# Patient Record
Sex: Female | Born: 1990 | Race: Black or African American | Hispanic: No | Marital: Married | Smoking: Former smoker
Health system: Southern US, Community
[De-identification: ages and names within clinical notes are randomized; demographics above are authoritative.]

## PROBLEM LIST (undated history)

## (undated) ENCOUNTER — Emergency Department (HOSPITAL_COMMUNITY): Admission: EM | Payer: 59 | Source: Home / Self Care

## (undated) DIAGNOSIS — O24419 Gestational diabetes mellitus in pregnancy, unspecified control: Secondary | ICD-10-CM

## (undated) DIAGNOSIS — J45909 Unspecified asthma, uncomplicated: Secondary | ICD-10-CM

## (undated) HISTORY — PX: NO PAST SURGERIES: SHX2092

## (undated) HISTORY — DX: Gestational diabetes mellitus in pregnancy, unspecified control: O24.419

---

## 2020-09-14 ENCOUNTER — Other Ambulatory Visit: Payer: Self-pay

## 2020-09-14 ENCOUNTER — Encounter (HOSPITAL_COMMUNITY): Payer: Self-pay | Admitting: Pediatrics

## 2020-09-14 ENCOUNTER — Emergency Department (HOSPITAL_COMMUNITY)
Admission: EM | Admit: 2020-09-14 | Discharge: 2020-09-14 | Disposition: A | Discharge status: Home / Self Care | Payer: Medicaid Other | Attending: Emergency Medicine | Admitting: Emergency Medicine

## 2020-09-14 DIAGNOSIS — Z5321 Procedure and treatment not carried out due to patient leaving prior to being seen by health care provider: Secondary | ICD-10-CM | POA: Diagnosis not present

## 2020-09-14 DIAGNOSIS — U071 COVID-19: Secondary | ICD-10-CM | POA: Diagnosis not present

## 2020-09-14 DIAGNOSIS — M791 Myalgia, unspecified site: Secondary | ICD-10-CM | POA: Diagnosis present

## 2020-09-14 LAB — RESPIRATORY PANEL BY RT PCR (FLU A&B, COVID)
Influenza A by PCR: NEGATIVE
Influenza B by PCR: NEGATIVE
SARS Coronavirus 2 by RT PCR: POSITIVE — AB

## 2020-09-14 NOTE — ED Triage Notes (Signed)
Patient reported tested + on home pregnancy approx 3 days ago; and thought generalized aches and malaise was from that. Stated a family member tested + for covid. Concern for exposure

## 2020-09-14 NOTE — ED Notes (Signed)
Pt states she is leaving  

## 2020-09-15 ENCOUNTER — Other Ambulatory Visit: Payer: Self-pay | Admitting: Family

## 2020-09-15 ENCOUNTER — Telehealth: Payer: Self-pay | Admitting: *Deleted

## 2020-09-15 ENCOUNTER — Other Ambulatory Visit: Payer: Self-pay

## 2020-09-15 ENCOUNTER — Ambulatory Visit
Admission: EM | Admit: 2020-09-15 | Discharge: 2020-09-15 | Disposition: A | Discharge status: Home / Self Care | Payer: Medicaid Other

## 2020-09-15 ENCOUNTER — Encounter: Payer: Self-pay | Admitting: *Deleted

## 2020-09-15 ENCOUNTER — Telehealth: Payer: Self-pay | Admitting: Family

## 2020-09-15 DIAGNOSIS — Z3201 Encounter for pregnancy test, result positive: Secondary | ICD-10-CM

## 2020-09-15 DIAGNOSIS — Z6839 Body mass index (BMI) 39.0-39.9, adult: Secondary | ICD-10-CM

## 2020-09-15 DIAGNOSIS — J4520 Mild intermittent asthma, uncomplicated: Secondary | ICD-10-CM

## 2020-09-15 DIAGNOSIS — Z3A01 Less than 8 weeks gestation of pregnancy: Secondary | ICD-10-CM

## 2020-09-15 DIAGNOSIS — U071 COVID-19: Secondary | ICD-10-CM

## 2020-09-15 HISTORY — DX: Unspecified asthma, uncomplicated: J45.909

## 2020-09-15 LAB — POCT URINE PREGNANCY: Preg Test, Ur: POSITIVE — AB

## 2020-09-15 MED ORDER — ALBUTEROL SULFATE HFA 108 (90 BASE) MCG/ACT IN AERS: 2.0000 | INHALATION_SPRAY | Freq: Four times a day (QID) | RESPIRATORY_TRACT | 2 refills | Status: DC | PRN

## 2020-09-15 MED ORDER — AEROCHAMBER PLUS FLO-VU MEDIUM MISC: 1.0000 | Freq: Once | 0 refills | Status: AC

## 2020-09-15 NOTE — Progress Notes (Signed)
I connected by phone with Susan Clements on 09/15/2020 at 4:15 PM to discuss the potential use of a new treatment for mild to moderate COVID-19 viral infection in non-hospitalized patients.  This patient is a 29 y.o. female that meets the FDA criteria for Emergency Use Authorization of COVID monoclonal antibody casirivimab/imdevimab or bamlanivimab/eteseviamb.  Has a (+) direct SARS-CoV-2 viral test result  Has mild or moderate COVID-19   Is NOT hospitalized due to COVID-19  Is within 10 days of symptom onset  Has at least one of the high risk factor(s) for progression to severe COVID-19 and/or hospitalization as defined in EUA.  Specific high risk criteria : BMI > 25, Pregnancy and Chronic Lung Disease   I have spoken and communicated the following to the patient or parent/caregiver regarding COVID monoclonal antibody treatment:  1. FDA has authorized the emergency use for the treatment of mild to moderate COVID-19 in adults and pediatric patients with positive results of direct SARS-CoV-2 viral testing who are 71 years of age and older weighing at least 40 kg, and who are at high risk for progressing to severe COVID-19 and/or hospitalization.  2. The significant known and potential risks and benefits of COVID monoclonal antibody, and the extent to which such potential risks and benefits are unknown.  3. Information on available alternative treatments and the risks and benefits of those alternatives, including clinical trials.  4. Patients treated with COVID monoclonal antibody should continue to self-isolate and use infection control measures (e.g., wear mask, isolate, social distance, avoid sharing personal items, clean and disinfect "high touch" surfaces, and frequent handwashing) according to CDC guidelines.   5. The patient or parent/caregiver has the option to accept or refuse COVID monoclonal antibody treatment.  After reviewing this information with the patient, the patient  has agreed to receive one of the available covid 19 monoclonal antibodies and will be provided an appropriate fact sheet prior to infusion.   Jeanine Luz, FNP 09/15/2020 4:15 PM

## 2020-09-15 NOTE — Discharge Instructions (Addendum)
It is very important to remember that since you have tested positive for Covid you need to be staying home and quarantining to help keep others safe and healthy in the community.  If you would like further evaluation for persistent or worsening symptoms, you may schedule an E-visit or virtual (video) visit throughout the [facility] MyChart app or website.  PLEASE NOTE: If you develop severe chest pain or shortness of breath please go to the ER or call 9-1-1 for further evaluation --> DO NOT schedule electronic or virtual visits for this. Please call our office for further guidance / recommendations as needed.  For information about the Covid vaccine, please visit [email or web address] 

## 2020-09-15 NOTE — Telephone Encounter (Signed)
Called to Discuss with patient about Covid symptoms and the use of the monoclonal antibody infusion for those with mild to moderate Covid symptoms and at a high risk of hospitalization.     Pt appears to qualify for this infusion due to co-morbid conditions and/or a member of an at-risk group in accordance with the FDA Emergency Use Authorization.    Susan Clements was seen in at the [facility] at [facility] earlier today following a positive COVID test result yesterday at the ED where she left without being seen. Was not having symptoms at the time. Qualifying risk factors include BMI >25, asthma and pregnancy. Symptom onset was 9/27 and has been experiencing cough, chills, nausea and feelings of pneumonia in her back. She is currently about [redacted] weeks pregnant.   Discussed the risks and benefits of treatment with Regeneron and she wishes to proceed with treatment.   Hello Susan Clements,   You have been scheduled to receive Regeneron (the monoclonal antibody we discussed) on : 09/16/20 at 11:30am   If you have been tested outside of a [facility] Facility - you MUST bring a copy of your positive test with you the morning of your appointment. You may take a photo of this and upload to your MyChart portal or have the testing facility fax the result to [phone]    The address for the infusion clinic site is:  --GPS address is [address] - the parking is located near [employer] building where you will see  COVID19 Infusion feather banner marking the entrance to parking.   (see photos below)            --Enter into the 2nd entrance where the "wave, flag banner" is at the road. Turn into this 2nd entrance and immediately turn left to park in 1 of the 5 parking spots.   --Please stay in your car and call the desk for assistance inside [phone].   --Average time in department is roughly 2 hours for Regeneron treatment - this includes preparation of the  medication, IV start and the required 1 hour monitoring after the infusion.    Should you develop worsening shortness of breath, chest pain or severe breathing problems please do not wait for this appointment and go to the Emergency room for evaluation and treatment. You will undergo another oxygen screen before your infusion to ensure this is the best treatment option for you. There is a chance that the best decision may be to send you to the Emergency Room for evaluation at the time of your appointment.   The day of your visit you should: Marland Kitchen Get plenty of rest the night before and drink plenty of water . Eat a light meal/snack before coming and take your medications as prescribed  . Wear warm, comfortable clothes with a shirt that can roll-up over the elbow (will need IV start).  . Wear a mask  . Consider bringing some activity to help pass the time  Many commercial insurers are waiving bills related to COVID treatment however some have ranged from $300-640. We are starting to see some insurers send bills to patients later for the administration of the medication - we are learning more information but you may receive a bill after your appointment.  Please contact your insurance agent to discuss prior to your appointment if you would like further details about billing specific to your policy.    The CPT code is [id number]  for your reference.    Marcos Eke, NP 09/15/2020 4:14 PM

## 2020-09-15 NOTE — Telephone Encounter (Signed)
Patient was at ED yesterday- she left after a few hours- + COVID- she has seen results in MyChart-. Advised: Patient notified of + COVID result. Patient test due to symptoms: fatigue, slight SOB at times. Patient advised to treat symptoms as needed OTC- make sure they are safe during pregnancy, contact PCP for follow up- virtual visit or UC for asthma symptoms- make sure provider is aware early pregnant and go to ED for trouble breathing ,dehydration or severe weakness. Advised to isolate and safe precautions in the home reviewed. CDC criteria for ending isolation given. Patient advised health dept may contact her- all + COVID results are reported.

## 2020-09-15 NOTE — ED Provider Notes (Signed)
[facility]    CSN: [id number] Arrival date & time: 09/15/20  1004      History   Chief Complaint Chief Complaint  Patient presents with  . Covid Positive    HPI Susan Clements is a 29 y.o. female  Patient presented to ER yesterday, though LWBS.  Covid test positive.  States she received call from ER nurse who recommended further follow-up as needed with PCP.  States she does not have 1, so she presented here.  Patient states "I feel surprisingly great ".  Denies fever, shortness of breath or chest pain, has had some wheezing.  Does not have inhaler at home: Requesting refill.  Patient states she took pregnancy test a few days ago: Positive.  Denying abdominal or pelvic pain, vaginal bleeding or discharge.  Past Medical History:  Diagnosis Date  . Asthma     There are no problems to display for this patient.   History reviewed. No pertinent surgical history.  OB History    Gravida  1   Para      Term      Preterm      AB      Living        SAB      TAB      Ectopic      Multiple      Live Births               Home Medications    Prior to Admission medications   Medication Sig Start Date End Date Taking? Authorizing Provider  Ascorbic Acid (VITAMIN C) 1000 MG tablet Take 1,000 mg by mouth daily.   Yes [provider]  zinc gluconate 50 MG tablet Take 22 mg by mouth daily.   Yes [provider]  albuterol (VENTOLIN HFA) 108 (90 Base) MCG/ACT inhaler Inhale 2 puffs into the lungs every 6 (six) hours as needed for wheezing or shortness of breath. 09/15/20   Hall-Potvin, [place], PA-C  Spacer/Aero-Holding Chambers (AEROCHAMBER PLUS FLO-VU MEDIUM) MISC 1 each by Other route once for 1 dose. 09/15/20 09/15/20  Hall-Potvin, [place], PA-C    Family History Family History  Problem Relation Age of Onset  . Healthy Mother   . Diabetes Father     Social History Social History   Tobacco Use  . Smoking status: Current  Some Day Smoker    Types: Cigars  . Smokeless tobacco: Never Used  Vaping Use  . Vaping Use: Never used  Substance Use Topics  . Alcohol use: Not Currently  . Drug use: Not Currently     Allergies   Nsaids   Review of Systems As per HPI   Physical Exam Triage Vital Signs ED Triage Vitals  Enc Vitals Group     BP 09/15/20 1039 108/75     Pulse Rate 09/15/20 1039 84     Resp 09/15/20 1039 18     Temp 09/15/20 1039 98.1 F (36.7 C)     Temp Source 09/15/20 1039 Oral     SpO2 09/15/20 1039 96 %     Weight --      Height --      Head Circumference --      Peak Flow --      Pain Score 09/15/20 1041 0     Pain Loc --      Pain Edu? --      Excl. in GC? --    No data found.  Updated Vital  Signs BP 108/75   Pulse 84   Temp 98.1 F (36.7 C) (Oral)   Resp 18   SpO2 96%   Visual Acuity Right Eye Distance:   Left Eye Distance:   Bilateral Distance:    Right Eye Near:   Left Eye Near:    Bilateral Near:     Physical Exam Constitutional:      General: She is not in acute distress.    Appearance: She is not ill-appearing or diaphoretic.  HENT:     Head: Normocephalic and atraumatic.     Mouth/Throat:     Mouth: Mucous membranes are moist.     Pharynx: Oropharynx is clear. No oropharyngeal exudate or posterior oropharyngeal erythema.  Eyes:     General: No scleral icterus.    Conjunctiva/sclera: Conjunctivae normal.     Pupils: Pupils are equal, round, and reactive to light.  Neck:     Comments: Trachea midline, negative JVD Cardiovascular:     Rate and Rhythm: Normal rate and regular rhythm.     Heart sounds: No murmur heard.  No gallop.   Pulmonary:     Effort: Pulmonary effort is normal. No respiratory distress.     Breath sounds: No wheezing, rhonchi or rales.  Musculoskeletal:     Cervical back: Neck supple. No tenderness.  Lymphadenopathy:     Cervical: No cervical adenopathy.  Skin:    Capillary Refill: Capillary refill takes less than 2  seconds.     Coloration: Skin is not jaundiced or pale.     Findings: No rash.  Neurological:     General: No focal deficit present.     Mental Status: She is alert and oriented to person, place, and time.      UC Treatments / Results  Labs (all labs ordered are listed, but only abnormal results are displayed) Labs Reviewed  POCT URINE PREGNANCY - Abnormal; Notable for the following components:      Result Value   Preg Test, Ur Positive (*)    All other components within normal limits    EKG   Radiology No results found.  Procedures Procedures (including critical care time)  Medications Ordered in UC Medications - No data to display  Initial Impression / Assessment and Plan / UC Course  I have reviewed the triage vital signs and the nursing notes.  Pertinent labs & imaging results that were available during my care of the patient were reviewed by me and considered in my medical decision making (see chart for details).     Patient afebrile, nontoxic, with SpO2 96%.  Urine pregnancy positive.  We will treat supportively as outlined below.  Return precautions discussed, patient verbalized understanding and is agreeable to plan. Final Clinical Impressions(s) / UC Diagnoses   Final diagnoses:  Less than [redacted] weeks gestation of pregnancy  COVID-19 virus infection     Discharge Instructions     It is very important to remember that since you have tested positive for Covid you need to be staying home and quarantining to help keep others safe and healthy in the community.  If you would like further evaluation for persistent or worsening symptoms, you may schedule an E-visit or virtual (video) visit throughout the [facility] app or website.  PLEASE NOTE: If you develop severe chest pain or shortness of breath please go to the ER or call 9-1-1 for further evaluation --> DO NOT schedule electronic or virtual visits for this. Please call our office for  further guidance  / recommendations as needed.  For information about the Covid vaccine, please visit [email or web address]    ED Prescriptions    Medication Sig Dispense Auth. Provider   albuterol (VENTOLIN HFA) 108 (90 Base) MCG/ACT inhaler Inhale 2 puffs into the lungs every 6 (six) hours as needed for wheezing or shortness of breath. 8 g Hall-Potvin, [place], PA-C   Spacer/Aero-Holding Chambers (AEROCHAMBER PLUS FLO-VU MEDIUM) MISC 1 each by Other route once for 1 dose. 1 each Hall-Potvin, [place], PA-C     PDMP not reviewed this encounter.   Hall-Potvin, [place], [place] 09/15/20 1111

## 2020-09-15 NOTE — ED Triage Notes (Signed)
Patient in feeling lethargic, aches to the top of back since yesterday.Nausea x 1 week. Patient received positive pregnancy and covid test in the ED on yesterday. Patient is experiencing wheezing today. Patient states she a little SOB. Patient states he has a hx of asthma and has been experiencing off and on since Jan of 2020.

## 2020-09-16 ENCOUNTER — Ambulatory Visit (HOSPITAL_COMMUNITY)
Admission: RE | Admit: 2020-09-16 | Discharge: 2020-09-16 | Disposition: A | Discharge status: Home / Self Care | Payer: Medicaid Other | Source: Ambulatory Visit | Attending: Pulmonary Disease | Admitting: Pulmonary Disease

## 2020-09-16 DIAGNOSIS — U071 COVID-19: Secondary | ICD-10-CM | POA: Diagnosis not present

## 2020-09-16 DIAGNOSIS — Z6839 Body mass index (BMI) 39.0-39.9, adult: Secondary | ICD-10-CM | POA: Insufficient documentation

## 2020-09-16 DIAGNOSIS — Z3A01 Less than 8 weeks gestation of pregnancy: Secondary | ICD-10-CM

## 2020-09-16 DIAGNOSIS — J4520 Mild intermittent asthma, uncomplicated: Secondary | ICD-10-CM | POA: Diagnosis present

## 2020-09-16 MED ORDER — SODIUM CHLORIDE 0.9 % IV SOLN
1200.0000 mg | Freq: Once | INTRAVENOUS | Status: AC
  Administered 2020-09-16: 1200 mg via INTRAVENOUS

## 2020-09-16 MED ORDER — METHYLPREDNISOLONE SODIUM SUCC 125 MG IJ SOLR: 125.0000 mg | Freq: Once | INTRAMUSCULAR | Status: DC | PRN

## 2020-09-16 MED ORDER — DIPHENHYDRAMINE HCL 50 MG/ML IJ SOLN: 50.0000 mg | Freq: Once | INTRAMUSCULAR | Status: DC | PRN

## 2020-09-16 MED ORDER — ALBUTEROL SULFATE HFA 108 (90 BASE) MCG/ACT IN AERS: 2.0000 | INHALATION_SPRAY | Freq: Once | RESPIRATORY_TRACT | Status: DC | PRN

## 2020-09-16 MED ORDER — EPINEPHRINE 0.3 MG/0.3ML IJ SOAJ: 0.3000 mg | Freq: Once | INTRAMUSCULAR | Status: DC | PRN

## 2020-09-16 MED ORDER — FAMOTIDINE IN NACL 20-0.9 MG/50ML-% IV SOLN: 20.0000 mg | Freq: Once | INTRAVENOUS | Status: DC | PRN

## 2020-09-16 MED ORDER — SODIUM CHLORIDE 0.9 % IV SOLN: INTRAVENOUS | Status: DC | PRN

## 2020-09-16 NOTE — Progress Notes (Signed)
  Diagnosis: COVID-19  Physician: Dr. Wright  Procedure: Covid Infusion Clinic Med: casirivimab\imdevimab infusion - Provided patient with casirivimab\imdevimab fact sheet for patients, parents and caregivers prior to infusion.  Complications: No immediate complications noted.  Discharge: Discharged home   Celine Dishman Ann 09/16/2020  

## 2020-09-16 NOTE — Discharge Instructions (Signed)

## 2020-10-29 ENCOUNTER — Ambulatory Visit (INDEPENDENT_AMBULATORY_CARE_PROVIDER_SITE_OTHER): Payer: Medicaid Other

## 2020-10-29 ENCOUNTER — Other Ambulatory Visit: Payer: Self-pay

## 2020-10-29 VITALS — BP 116/73 | HR 91 | Ht 64.0 in | Wt 245.9 lb

## 2020-10-29 DIAGNOSIS — O3680X0 Pregnancy with inconclusive fetal viability, not applicable or unspecified: Secondary | ICD-10-CM

## 2020-10-29 DIAGNOSIS — Z789 Other specified health status: Secondary | ICD-10-CM

## 2020-10-29 DIAGNOSIS — Z3491 Encounter for supervision of normal pregnancy, unspecified, first trimester: Secondary | ICD-10-CM

## 2020-10-29 DIAGNOSIS — Z3A12 12 weeks gestation of pregnancy: Secondary | ICD-10-CM | POA: Diagnosis not present

## 2020-10-29 DIAGNOSIS — O0990 Supervision of high risk pregnancy, unspecified, unspecified trimester: Secondary | ICD-10-CM | POA: Insufficient documentation

## 2020-10-29 DIAGNOSIS — Z3480 Encounter for supervision of other normal pregnancy, unspecified trimester: Secondary | ICD-10-CM | POA: Insufficient documentation

## 2020-10-29 MED ORDER — BLOOD PRESSURE KIT DEVI: 1.0000 | 0 refills | Status: DC

## 2020-10-29 MED ORDER — PREPLUS 27-1 MG PO TABS: 1.0000 | ORAL_TABLET | Freq: Every day | ORAL | 13 refills | Status: DC

## 2020-10-29 NOTE — Progress Notes (Signed)
PRENATAL INTAKE SUMMARY  Ms. Novicki presents today New OB Nurse Interview.  OB History    Gravida  5   Para  2   Term  2   Preterm      AB  2   Living  2     SAB      TAB  2   Ectopic      Multiple      Live Births  2          I have reviewed the patient's medical, obstetrical, social, and family histories, medications, and available lab results.  SUBJECTIVE She has no unusual complaints  OBJECTIVE Initial Physical Exam (New OB)  GENERAL APPEARANCE: alert, well appearing   ASSESSMENT Normal pregnancy  PLAN Prenatal care to be completed at [place] labs to be completed at [facility] provider visit Blood pressure kit ordered Baby scripts ordered U/S performed today reveals single live IUP at 12wd5 by Howard. FHR 142 Prenatal vitamin sent to pharmacy PHQ2-9 score: 5 GAD7 score: 14

## 2020-11-05 ENCOUNTER — Ambulatory Visit (INDEPENDENT_AMBULATORY_CARE_PROVIDER_SITE_OTHER): Payer: Medicaid Other | Admitting: Certified Nurse Midwife

## 2020-11-05 ENCOUNTER — Other Ambulatory Visit: Payer: Self-pay

## 2020-11-05 ENCOUNTER — Encounter: Payer: Self-pay | Admitting: Certified Nurse Midwife

## 2020-11-05 ENCOUNTER — Other Ambulatory Visit (HOSPITAL_COMMUNITY)
Admission: RE | Admit: 2020-11-05 | Discharge: 2020-11-05 | Disposition: A | Discharge status: Home / Self Care | Payer: Medicaid Other | Source: Ambulatory Visit | Attending: Certified Nurse Midwife | Admitting: Certified Nurse Midwife

## 2020-11-05 VITALS — BP 109/73 | HR 97 | Wt 250.0 lb

## 2020-11-05 DIAGNOSIS — Z3482 Encounter for supervision of other normal pregnancy, second trimester: Secondary | ICD-10-CM | POA: Diagnosis not present

## 2020-11-05 DIAGNOSIS — O99210 Obesity complicating pregnancy, unspecified trimester: Secondary | ICD-10-CM

## 2020-11-05 DIAGNOSIS — U071 COVID-19: Secondary | ICD-10-CM | POA: Insufficient documentation

## 2020-11-05 DIAGNOSIS — Z3480 Encounter for supervision of other normal pregnancy, unspecified trimester: Secondary | ICD-10-CM | POA: Insufficient documentation

## 2020-11-05 DIAGNOSIS — Z3A14 14 weeks gestation of pregnancy: Secondary | ICD-10-CM | POA: Diagnosis not present

## 2020-11-05 DIAGNOSIS — O98511 Other viral diseases complicating pregnancy, first trimester: Secondary | ICD-10-CM | POA: Insufficient documentation

## 2020-11-05 DIAGNOSIS — O99330 Smoking (tobacco) complicating pregnancy, unspecified trimester: Secondary | ICD-10-CM

## 2020-11-05 MED ORDER — NICOTINE 14 MG/24HR TD PT24: 14.0000 mg | MEDICATED_PATCH | Freq: Every day | TRANSDERMAL | 1 refills | Status: DC

## 2020-11-05 NOTE — Patient Instructions (Addendum)
Pregnancy and Smoking Smoking during pregnancy is unhealthy for you and your baby. Smoke from cigarettes, e-cigarettes, pipes, and cigars contains many chemicals that can cause cancer (carcinogens). These products also contain a stimulant drug (nicotine). When you smoke, harmful substances that you breathe in enter your bloodstream and can be passed on to your baby. This can affect your baby's development. If you are planning to become pregnant or have recently become pregnant, talk with your health care provider about quitting smoking. You have a much better chance of having a healthy pregnancy and a healthy baby if you do not smoke while you are pregnant. How does smoking affect me? Smoking increases your risk for many long-term (chronic) diseases. These diseases include cancer, lung diseases, and heart disease. Smoking during pregnancy increases your risk of:  Losing the pregnancy (miscarriage or stillbirth).  Giving birth too early (premature birth).  Pregnancy outside of the uterus (tubal pregnancy).  Problems with the placenta, which is the organ that provides the baby nourishment and oxygen. These problems may include: ? Attachment of the placenta over the opening of the uterus (placenta previa). ? Detachment of the placenta before the baby's birth (placental abruption).  Having your water break before labor begins. How does smoking affect my baby? Before birth Smoking during pregnancy:  Decreases blood flow and oxygen to your baby.  Increases your baby's risk of birth defects, such as heart defects.  Increases your baby's heart rate.  Slows your baby's growth in the uterus (intrauterine growth retardation). After birth Babies born to women who smoked during pregnancy may:  Have symptoms of nicotine withdrawal.  Be born with a cleft lip, cleft palate, or other facial deformities.  Be too small at birth.  Have a high risk of: ? Serious health problems or lifelong  disabilities. These may result in the long-term need for certain medicines, therapies, or other treatments. ? Sudden infant death syndrome (SIDS). Follow these instructions at home:   Do not use any products that contain nicotine or tobacco, such as cigarettes, e-cigarettes, and chewing tobacco. If you need help quitting, ask your health care provider.  Talk with your health care provider about support strategies to quit smoking. Some methods to consider include: ? Counseling (smoking cessation counseling). ? Psychotherapy. ? Acupuncture. ? Hypnosis. ? Telephone hotlines for people trying to quit.  Do not take nicotine supplements or medicine to help you quit smoking unless your health care provider tells you to do so.  Avoid secondhand smoke. Ask people who smoke to avoid smoking around you.  Identify people, places, things, and activities that make you want to smoke (triggers). Avoid them. Where to find more information Learn more about smoking during pregnancy and quitting smoking from:  March of Dimes: www.marchofdimes.org  U.S. Department of Health and Human Services: women.smokefree.gov  American Cancer Society: www.cancer.org  American Heart Association: www.heart.org  National Cancer Institute: www.cancer.gov For help to quit smoking:  National smoking cessation telephone hotline: 1-800-QUIT NOW (784-8669) Contact a health care provider if:  You are struggling to quit smoking.  You are a smoker and you become pregnant or plan to become pregnant.  You start smoking again after giving birth. Summary  Smoking during pregnancy is unhealthy for you and your baby.  Tobacco smoke contains harmful substances that can affect a baby's health and development.  Smoking increases the risk for serious problems, such as miscarriage, birth defects, or premature birth.  If you need help to quit smoking, talk to your health   care provider and ask about support strategies such as  counseling. This information is not intended to replace advice given to you by your health care provider. Make sure you discuss any questions you have with your health care provider. Document Revised: 07/04/2019 Document Reviewed: 07/04/2019 Elsevier Patient Education  2020 [employer].   Safe Medications in Pregnancy   Acne: Benzoyl Peroxide Salicylic Acid  Backache/Headache: Tylenol: 2 regular strength every 4 hours OR              2 Extra strength every 6 hours  Colds/Coughs/Allergies: Benadryl (alcohol free) 25 mg every 6 hours as needed Breath right strips Claritin Cepacol throat lozenges Chloraseptic throat spray Cold-Eeze- up to three times per day Cough drops, alcohol free Flonase (by prescription only) Guaifenesin Mucinex Robitussin DM (plain only, alcohol free) Saline nasal spray/drops Sudafed (pseudoephedrine) & Actifed ** use only after [redacted] weeks gestation and if you do not have high blood pressure Tylenol Vicks Vaporub Zinc lozenges Zyrtec   Constipation: Colace Ducolax suppositories Fleet enema Glycerin suppositories Metamucil Milk of magnesia Miralax Senokot Smooth move tea  Diarrhea: Kaopectate Imodium A-D  *NO pepto Bismol  Hemorrhoids: Anusol Anusol HC Preparation H Tucks  Indigestion: Tums Maalox Mylanta Zantac  Pepcid  Insomnia: Benadryl (alcohol free) 25mg  every 6 hours as needed Tylenol PM Unisom, no Gelcaps  Leg Cramps: Tums MagGel  Nausea/Vomiting:  Bonine Dramamine Emetrol Ginger extract Sea bands Meclizine  Nausea medication to take during pregnancy:  Unisom (doxylamine succinate 25 mg tablets) Take one tablet daily at bedtime. If symptoms are not adequately controlled, the dose can be increased to a maximum recommended dose of two tablets daily (1/2 tablet in the morning, 1/2 tablet mid-afternoon and one at bedtime). Vitamin B6 100mg  tablets. Take one tablet twice a day (up to 200 mg per day).  Skin  Rashes: Aveeno products Benadryl cream or 25mg  every 6 hours as needed Calamine Lotion 1% cortisone cream  Yeast infection: Gyne-lotrimin 7 Monistat 7   **If taking multiple medications, please check labels to avoid duplicating the same active ingredients **take medication as directed on the label ** Do not exceed 4000 mg of tylenol in 24 hours **Do not take medications that contain aspirin or ibuprofen   Second Trimester of Pregnancy  The second trimester is from week 14 through week 27 (month 4 through 6). This is often the time in pregnancy that you feel your best. Often times, morning sickness has lessened or quit. You may have more energy, and you may get hungry more often. Your unborn baby is growing rapidly. At the end of the sixth month, he or she is about 9 inches long and weighs about 1 pounds. You will likely feel the baby move between 18 and 20 weeks of pregnancy. Follow these instructions at home: Medicines  Take over-the-counter and prescription medicines only as told by your doctor. Some medicines are safe and some medicines are not safe during pregnancy.  Take a prenatal vitamin that contains at least 600 micrograms (mcg) of folic acid.  If you have trouble pooping (constipation), take medicine that will make your stool soft (stool softener) if your doctor approves. Eating and drinking   Eat regular, healthy meals.  Avoid raw meat and uncooked cheese.  If you get low calcium from the food you eat, talk to your doctor about taking a daily calcium supplement.  Avoid foods that are high in fat and sugars, such as fried and sweet foods.  If you feel sick  to your stomach (nauseous) or throw up (vomit): ? Eat 4 or 5 small meals a day instead of 3 large meals. ? Try eating a few soda crackers. ? Drink liquids between meals instead of during meals.  To prevent constipation: ? Eat foods that are high in fiber, like fresh fruits and vegetables, whole grains, and  beans. ? Drink enough fluids to keep your pee (urine) clear or pale yellow. Activity  Exercise only as told by your doctor. Stop exercising if you start to have cramps.  Do not exercise if it is too hot, too humid, or if you are in a place of great height (high altitude).  Avoid heavy lifting.  Wear low-heeled shoes. Sit and stand up straight.  You can continue to have sex unless your doctor tells you not to. Relieving pain and discomfort  Wear a good support bra if your breasts are tender.  Take warm water baths (sitz baths) to soothe pain or discomfort caused by hemorrhoids. Use hemorrhoid cream if your doctor approves.  Rest with your legs raised if you have leg cramps or low back pain.  If you develop puffy, bulging veins (varicose veins) in your legs: ? Wear support hose or compression stockings as told by your doctor. ? Raise (elevate) your feet for 15 minutes, 3-4 times a day. ? Limit salt in your food. Prenatal care  Write down your questions. Take them to your prenatal visits.  Keep all your prenatal visits as told by your doctor. This is important. Safety  Wear your seat belt when driving.  Make a list of emergency phone numbers, including numbers for family, friends, the hospital, and police and fire departments. General instructions  Ask your doctor about the right foods to eat or for help finding a counselor, if you need these services.  Ask your doctor about local prenatal classes. Begin classes before month 6 of your pregnancy.  Do not use hot tubs, steam rooms, or saunas.  Do not douche or use tampons or scented sanitary pads.  Do not cross your legs for long periods of time.  Visit your dentist if you have not done so. Use a soft toothbrush to brush your teeth. Floss gently.  Avoid all smoking, herbs, and alcohol. Avoid drugs that are not approved by your doctor.  Do not use any products that contain nicotine or tobacco, such as cigarettes and  e-cigarettes. If you need help quitting, ask your doctor.  Avoid cat litter boxes and soil used by cats. These carry germs that can cause birth defects in the baby and can cause a loss of your baby (miscarriage) or stillbirth. Contact a doctor if:  You have mild cramps or pressure in your lower belly.  You have pain when you pee (urinate).  You have bad smelling fluid coming from your vagina.  You continue to feel sick to your stomach (nauseous), throw up (vomit), or have watery poop (diarrhea).  You have a nagging pain in your belly area.  You feel dizzy. Get help right away if:  You have a fever.  You are leaking fluid from your vagina.  You have spotting or bleeding from your vagina.  You have severe belly cramping or pain.  You lose or gain weight rapidly.  You have trouble catching your breath and have chest pain.  You notice sudden or extreme puffiness (swelling) of your face, hands, ankles, feet, or legs.  You have not felt the baby move in over an hour.  You have severe headaches that do not go away when you take medicine.  You have trouble seeing. Summary  The second trimester is from week 14 through week 27 (months 4 through 6). This is often the time in pregnancy that you feel your best.  To take care of yourself and your unborn baby, you will need to eat healthy meals, take medicines only if your doctor tells you to do so, and do activities that are safe for you and your baby.  Call your doctor if you get sick or if you notice anything unusual about your pregnancy. Also, call your doctor if you need help with the right food to eat, or if you want to know what activities are safe for you. This information is not intended to replace advice given to you by your health care provider. Make sure you discuss any questions you have with your health care provider. Document Revised: 03/28/2019 Document Reviewed: 01/09/2017 Elsevier Patient Education  2020 [employer].

## 2020-11-05 NOTE — Progress Notes (Signed)
History:   Susan Clements is a 29 y.o. [id number] at [redacted]w[redacted]d by LMP being seen today for her first obstetrical visit.  Her obstetrical history is significant for excessive weight gain, obesity and smoker. Patient does intend to breast feed. Pregnancy history fully reviewed. Patient has recently moved from [place] to [facility]. Patient reports this was a surprise pregnancy but patient and husband happy about pregnancy and has great support system. Sister also lives in [place].   Patient reports no complaints.     HISTORY: OB History  Gravida Para Term Preterm AB Living  $Remov'5 2 2 'HIFqVo$ 0 2 2  SAB TAB Ectopic Multiple Live Births  0 2 0 0 2    # Outcome Date GA Lbr Len/2nd Weight Sex Delivery Anes PTL Lv  5 Current           4 TAB 2015          3 TAB 2015          2 Term 11/12/12 [redacted]w[redacted]d   F Vag-Spont   LIV  1 Term 07/28/09 [redacted]w[redacted]d   M Vag-Spont   LIV    Patient unsure of when last pap smear.   Past Medical History:  Diagnosis Date  . Asthma    History reviewed. No pertinent surgical history. Family History  Problem Relation Age of Onset  . Healthy Mother   . Diabetes Father   . Throat cancer Maternal Grandmother   . Hypertension Maternal Grandmother   . Diabetes Maternal Grandmother   . Heart failure Maternal Grandmother   . Hypertension Paternal Grandmother    Social History   Tobacco Use  . Smoking status: Current Every Day Smoker    Packs/day: 0.25    Types: Cigars, Cigarettes  . Smokeless tobacco: Never Used  . Tobacco comment: 6-7 cigarettes  Vaping Use  . Vaping Use: Never used  Substance Use Topics  . Alcohol use: Not Currently    Comment: last drink was 1 month ago  . Drug use: Not Currently   Allergies  Allergen Reactions  . Nsaids    Current Outpatient Medications on File Prior to Visit  Medication Sig Dispense Refill  . Prenatal Vit-Fe Fumarate-FA (PREPLUS) 27-1 MG TABS Take 1 tablet by mouth daily. 30 tablet 13  . albuterol (VENTOLIN HFA) 108 (90 Base) MCG/ACT  inhaler Inhale 2 puffs into the lungs every 6 (six) hours as needed for wheezing or shortness of breath. 8 g 2  . Ascorbic Acid (VITAMIN C) 1000 MG tablet Take 1,000 mg by mouth daily.    . Blood Pressure Monitoring (BLOOD PRESSURE KIT) DEVI 1 kit by Does not apply route once a week. 1 each 0  . zinc gluconate 50 MG tablet Take 22 mg by mouth daily.     No current facility-administered medications on file prior to visit.    Review of Systems Pertinent items noted in HPI and remainder of comprehensive ROS otherwise negative.  Physical Exam:   Vitals:   11/05/20 0939  BP: 109/73  Pulse: 97  Weight: 250 lb (113.4 kg)   Fetal Heart Rate (bpm): 145  General: well-developed, obese female in no acute distress  Skin: normal coloration and turgor, no rashes  Neurologic: oriented, normal, negative, normal mood  Extremities: normal strength, tone, and muscle mass, ROM of all joints is normal  HEENT PERRLA, extraocular movement intact and sclera clear  Neck supple and no masses  Cardiovascular: regular rate and rhythm  Respiratory:  no respiratory distress,  normal breath sounds  Abdomen: soft, non-tender; bowel sounds normal; no masses,  no organomegaly  Pelvic: normal external genitalia, no lesions, normal vaginal mucosa, normal vaginal discharge, normal cervix, pap smear done. Uterine size:      Assessment:    Pregnancy: [id number] Patient Active Problem List   Diagnosis Date Noted  . Tobacco use during pregnancy, antepartum 11/05/2020  . Obesity during pregnancy 11/05/2020  . Supervision of other normal pregnancy, antepartum 10/29/2020     Plan:    1. Supervision of other normal pregnancy, antepartum - Welcomed to practice and introduced self to patient  - Reviewed safety, visitor policy, reassurance about COVID-19 for pregnancy at this time. Discussed possible changes to visits, including televisits, that may occur due to COVID-19.  The office remains open if pt needs to be seen  and MAU is open 24 hours/day for OB emergencies. - Anticipatory guidance on upcoming appointments and mix of virtual/in person appointments  - Cytology - PAP( [place]) - Culture, OB Urine - Genetic Screening - CBC/D/Plt+RPR+Rh+ABO+Rub Ab... - [place] MFM OB DETAIL +14 WK; Future  2. [redacted] weeks gestation of pregnancy  3. Tobacco use during pregnancy, antepartum - Patient reports that she has continued to smoke during pregnancy, educated and discussed smoking cessation and increased risk of miscarriage, PTB, stillbirth and IUFD associated with smoking.  - Patient verbalizes understanding and wants to stop smoking  - Educated and discussed nicotine patches and gradually decreased cigarette usage to be able to get to complete cessation    - [place] MFM OB DETAIL +14 WK; Future - nicotine (NICODERM CQ - DOSED IN MG/24 HOURS) 14 mg/24hr patch; Place 1 patch (14 mg total) onto the skin daily.  Dispense: 28 patch; Refill: 1  4. Obesity during pregnancy - BMI 42.91 - Educated and discussed recommended weight gain during pregnancy, patient has already gained 30lbs during this pregnancy - patient reports almost 100lb weight gain with previous pregnancy  - Educated and discussed exercising during pregnancy, 30-40 minutes at least 3-4x per week, healthier food choices - patient verbalizes understanding  - Hemoglobin A1c - Comp Met (CMET) - Protein / creatinine ratio, urine   Initial labs drawn. Continue prenatal vitamins. Problem list reviewed and updated. Genetic Screening discussed, NIPS: ordered. Ultrasound discussed; fetal anatomic survey: ordered. Anticipatory guidance about prenatal visits given including labs, ultrasounds, and testing. Discussed usage of Babyscripts and virtual visits as additional source of managing and completing prenatal visits in midst of coronavirus and pandemic.   Encouraged to complete MyChart Registration for her ability to review results, send requests, and have questions  addressed.  The nature of [place] for [facility] Healthcare/Faculty Practice with multiple MDs and Advanced Practice Providers was explained to patient; also emphasized that residents, students are part of our team. Routine obstetric precautions reviewed. Encouraged to seek out care at office or emergency room [facility] MAU preferred) for urgent and/or emergent concerns. Return in about 4 weeks (around 12/03/2020) for LROB, in person.     Lajean Manes, [place] for [employer], [place]

## 2020-11-06 LAB — COMPREHENSIVE METABOLIC PANEL
ALT: 19 IU/L (ref 0–32)
AST: 20 IU/L (ref 0–40)
Albumin/Globulin Ratio: 1.6 (ref 1.2–2.2)
Albumin: 3.9 g/dL (ref 3.8–4.8)
Alkaline Phosphatase: 38 IU/L — ABNORMAL LOW (ref 44–121)
BUN/Creatinine Ratio: 11 (ref 9–23)
BUN: 8 mg/dL (ref 6–20)
Bilirubin Total: 0.3 mg/dL (ref 0.0–1.2)
CO2: 18 mmol/L — ABNORMAL LOW (ref 20–29)
Calcium: 9.2 mg/dL (ref 8.7–10.2)
Chloride: 102 mmol/L (ref 96–106)
Creatinine, Ser: 0.76 mg/dL (ref 0.57–1.00)
GFR calc Af Amer: 121 mL/min/{1.73_m2} (ref >59)
GFR calc non Af Amer: 105 mL/min/{1.73_m2} (ref >59)
Globulin, Total: 2.5 g/dL (ref 1.5–4.5)
Glucose: 123 mg/dL — ABNORMAL HIGH (ref 65–99)
Potassium: 3.9 mmol/L (ref 3.5–5.2)
Sodium: 136 mmol/L (ref 134–144)
Total Protein: 6.4 g/dL (ref 6.0–8.5)

## 2020-11-06 LAB — CBC/D/PLT+RPR+RH+ABO+RUB AB...
Antibody Screen: NEGATIVE
Basophils Absolute: 0 10*3/uL (ref 0.0–0.2)
Basos: 1 %
EOS (ABSOLUTE): 0.1 10*3/uL (ref 0.0–0.4)
Eos: 2 %
HCV Ab: <0.1 s/co ratio (ref 0.0–0.9)
HIV Screen 4th Generation wRfx: NONREACTIVE
Hematocrit: 31.4 % — ABNORMAL LOW (ref 34.0–46.6)
Hemoglobin: 11.1 g/dL (ref 11.1–15.9)
Hepatitis B Surface Ag: NEGATIVE
Immature Grans (Abs): 0 10*3/uL (ref 0.0–0.1)
Immature Granulocytes: 0 %
Lymphocytes Absolute: 1.9 10*3/uL (ref 0.7–3.1)
Lymphs: 33 %
MCH: 31.9 pg (ref 26.6–33.0)
MCHC: 35.4 g/dL (ref 31.5–35.7)
MCV: 90 fL (ref 79–97)
Monocytes Absolute: 0.4 10*3/uL (ref 0.1–0.9)
Monocytes: 8 %
Neutrophils Absolute: 3.3 10*3/uL (ref 1.4–7.0)
Neutrophils: 56 %
Platelets: 295 10*3/uL (ref 150–450)
RBC: 3.48 x10E6/uL — ABNORMAL LOW (ref 3.77–5.28)
RDW: 10.9 % — ABNORMAL LOW (ref 11.7–15.4)
RPR Ser Ql: NONREACTIVE
Rh Factor: POSITIVE
Rubella Antibodies, IGG: 1 index (ref >0.99)
WBC: 5.7 10*3/uL (ref 3.4–10.8)

## 2020-11-06 LAB — PROTEIN / CREATININE RATIO, URINE
Creatinine, Urine: 157.5 mg/dL
Protein, Ur: 10.6 mg/dL
Protein/Creat Ratio: 67 mg/g creat (ref 0–200)

## 2020-11-06 LAB — HEMOGLOBIN A1C
Est. average glucose Bld gHb Est-mCnc: 91 mg/dL
Hgb A1c MFr Bld: 4.8 % (ref 4.8–5.6)

## 2020-11-06 LAB — HCV INTERPRETATION

## 2020-11-08 LAB — URINE CULTURE, OB REFLEX

## 2020-11-08 LAB — CULTURE, OB URINE

## 2020-11-10 LAB — CYTOLOGY - PAP
Chlamydia: NEGATIVE
Comment: NEGATIVE
Comment: NEGATIVE
Comment: NEGATIVE
Comment: NORMAL
Diagnosis: NEGATIVE
High risk HPV: NEGATIVE
Neisseria Gonorrhea: NEGATIVE
Trichomonas: NEGATIVE

## 2020-11-15 ENCOUNTER — Encounter: Payer: Self-pay | Admitting: Certified Nurse Midwife

## 2020-11-17 ENCOUNTER — Encounter: Payer: Self-pay | Admitting: Certified Nurse Midwife

## 2020-12-03 ENCOUNTER — Encounter: Payer: Self-pay | Admitting: Obstetrics and Gynecology

## 2020-12-03 ENCOUNTER — Other Ambulatory Visit: Payer: Self-pay

## 2020-12-03 ENCOUNTER — Ambulatory Visit (INDEPENDENT_AMBULATORY_CARE_PROVIDER_SITE_OTHER): Payer: Medicaid Other | Admitting: Obstetrics and Gynecology

## 2020-12-03 DIAGNOSIS — Z3480 Encounter for supervision of other normal pregnancy, unspecified trimester: Secondary | ICD-10-CM

## 2020-12-03 MED ORDER — COMFORT FIT MATERNITY SUPP LG MISC: 1.0000 | Freq: Every day | 0 refills | Status: DC | PRN

## 2020-12-03 NOTE — Patient Instructions (Signed)

## 2020-12-03 NOTE — Progress Notes (Signed)
   PRENATAL VISIT NOTE  Subjective:  Susan Clements is a 29 y.o. [id number] at [redacted]w[redacted]d being seen today for ongoing prenatal care.  She is currently monitored for the following issues for this low-risk pregnancy and has Supervision of other normal pregnancy, antepartum; Tobacco use during pregnancy, antepartum; Obesity during pregnancy; and COVID-19 affecting pregnancy in first trimester on their problem list.  Patient reports no complaints.  Contractions: Not present. Vag. Bleeding: None.  Movement: Present. Denies leaking of fluid.   The following portions of the patient's history were reviewed and updated as appropriate: allergies, current medications, past family history, past medical history, past social history, past surgical history and problem list.   Objective:   Vitals:   12/03/20 0944  BP: 124/71  Pulse: (!) 107  Weight: 255 lb (115.7 kg)    Fetal Status: Fetal Heart Rate (bpm): 144   Movement: Present     General:  Alert, oriented and cooperative. Patient is in no acute distress.  Skin: Skin is warm and dry. No rash noted.   Cardiovascular: Normal heart rate noted  Respiratory: Normal respiratory effort, no problems with respiration noted  Abdomen: Soft, gravid, appropriate for gestational age.  Pain/Pressure: Present     Pelvic: Cervical exam deferred        Extremities: Normal range of motion.  Edema: None  Mental Status: Normal mood and affect. Normal behavior. Normal judgment and thought content.   Assessment and Plan:  Pregnancy: [id number] at [redacted]w[redacted]d 1. Supervision of other normal pregnancy, antepartum  - AFP, Serum, Open Spina Bifida - Severe allergy to ASA  - Having pelvic pressure/ round ligament pain. Recent urine culture WNL. Rx: Pregnancy support belt.  - Planning to get Covid-19 vaccine soon.   Preterm labor symptoms and general obstetric precautions including but not limited to vaginal bleeding, contractions, leaking of fluid and fetal movement were reviewed  in detail with the patient. Please refer to After Visit Summary for other counseling recommendations.   No follow-ups on file.  No future appointments.  Venia Carbon, NP

## 2020-12-03 NOTE — Progress Notes (Signed)
ROB   AFP today.  CC: None    

## 2020-12-05 ENCOUNTER — Ambulatory Visit (HOSPITAL_COMMUNITY)
Admission: EM | Admit: 2020-12-05 | Discharge: 2020-12-05 | Disposition: A | Discharge status: Home / Self Care | Payer: Medicaid Other | Attending: Psychiatry | Admitting: Psychiatry

## 2020-12-05 ENCOUNTER — Other Ambulatory Visit: Payer: Self-pay

## 2020-12-05 DIAGNOSIS — F332 Major depressive disorder, recurrent severe without psychotic features: Secondary | ICD-10-CM

## 2020-12-05 DIAGNOSIS — F322 Major depressive disorder, single episode, severe without psychotic features: Secondary | ICD-10-CM | POA: Insufficient documentation

## 2020-12-05 LAB — AFP, SERUM, OPEN SPINA BIFIDA
AFP MoM: 0.73
AFP Value: 25.9 ng/mL
Gest. Age on Collection Date: 18 weeks
Maternal Age At EDD: 31.5 yr
OSBR Risk 1 IN: 10000
Test Results:: NEGATIVE
Weight: 255 [lb_av]

## 2020-12-05 NOTE — ED Notes (Signed)
LOCKER #[id number] 

## 2020-12-05 NOTE — BH Assessment (Signed)
Comprehensive Clinical Assessment (CCA) Note  12/05/2020 Susan Clements [id number]     Patient presents to the [facility] as a walk-in seeking help for her depression.  Patient states that she has been depressed since she was an adolescent and she states that she was hospitalized in [place].  Patient states that since her hospitalization that she has not had any follow-up services.  Patient denies current suicidal thoughts, but states that she has experienced them in the past, but states that she has never acted on them.  Patient states that she is currently experiencing a lack of motivation and concentration, her appetite fluctuates  form not eating to over-eating, she states that her sleep is either all day or none at all. Patient denies HI/Psychosis and states that she has no SA issues.  Patient states that she just lacks the ability to cope with her depression and states that she has things that occurred to her throughout her life that hurt her and she states that she has never dealt with them and feels like she needs a therapist.  Patient states that she is always stressed and states that she feels like she does not do enough for her family and herself.  Patient states that her family is also stressed with their finanal situation,  Patient denies any history of abuse or self-mutilation.  Patient presents as alert and oriented, her thoughts are organized and her memory is intact.  Her judgment, insight and impulse control appear to be intact.  She does not appear to be responding to any internal stimuli.   Chief Complaint:  Chief Complaint  Patient presents with  . Depression   Visit Diagnosis: F32.2 MDD Single Episode Severe    CCA Screening, Triage and Referral (STR)  Patient Reported Information How did you hear about [place]? Other (Comment) (Phreesia 12/05/2020)  Referral name: Search Engine (Phreesia 12/05/2020)  Referral phone number: No data recorded  Whom do you see for routine  medical problems? I don't have a doctor (Phreesia 12/05/2020)  Practice/Facility Name: No data recorded Practice/Facility Phone Number: No data recorded Name of Contact: No data recorded Contact Number: No data recorded Contact Fax Number: No data recorded Prescriber Name: No data recorded Prescriber Address (if known): No data recorded  What Is the Reason for Your Visit/Call Today? I am depressed and anxious  (Phreesia 12/05/2020)  How Long Has This Been Causing You Problems? > than 6 months (Phreesia 12/05/2020)  What Do You Feel Would Help You the Most Today? Other (Comment) (Phreesia 12/05/2020)   Have You Recently Been in Any Inpatient Treatment (Hospital/Detox/Crisis Center/28-Day Program)? No (Phreesia 12/05/2020)  Name/Location of Program/Hospital:No data recorded How Long Were You There? No data recorded When Were You Discharged? No data recorded  Have You Ever Received Services From [facility] Before? Yes (Phreesia 12/05/2020)  Who Do You See at [facility]? [facility] (Phreesia 12/05/2020)   Have You Recently Had Any Thoughts About Hurting Yourself? No (Phreesia 12/05/2020)  Are You Planning to Commit Suicide/Harm Yourself At This time? No (Phreesia 12/05/2020)   Have you Recently Had Thoughts About Hurting Someone Karolee Ohs? No (Phreesia 12/05/2020)  Explanation: No data recorded  Have You Used Any Alcohol or Drugs in the Past 24 Hours? No (Phreesia 12/05/2020)  How Long Ago Did You Use Drugs or Alcohol? No data recorded What Did You Use and How Much? No data recorded  Do You Currently Have a Therapist/Psychiatrist? No (Phreesia 12/05/2020)  Name of Therapist/Psychiatrist: No data recorded  Have You Been Recently Discharged From Any Office Practice or Programs? No (Phreesia 12/05/2020)  Explanation of Discharge From Practice/Program: No data recorded    CCA Screening Triage Referral Assessment Type of Contact: No data recorded Is this Initial or  Reassessment? No data recorded Date Telepsych consult ordered in [facility]:  No data recorded Time Telepsych consult ordered in [facility]:  No data recorded  Patient Reported Information Reviewed? No data recorded Patient Left Without Being Seen? No data recorded Reason for Not Completing Assessment: No data recorded  Collateral Involvement: No data recorded  Does Patient Have a Court Appointed Legal Guardian? No data recorded Name and Contact of Legal Guardian: No data recorded If Minor and Not Living with Parent(s), Who has Custody? No data recorded Is CPS involved or ever been involved? No data recorded Is APS involved or ever been involved? No data recorded  Patient Determined To Be At Risk for Harm To Self or Others Based on Review of Patient Reported Information or Presenting Complaint? No data recorded Method: No data recorded Availability of Means: No data recorded Intent: No data recorded Notification Required: No data recorded Additional Information for Danger to Others Potential: No data recorded Additional Comments for Danger to Others Potential: No data recorded Are There Guns or Other Weapons in Your Home? No data recorded Types of Guns/Weapons: No data recorded Are These Weapons Safely Secured?                            No data recorded Who Could Verify You Are Able To Have These Secured: No data recorded Do You Have any Outstanding Charges, Pending Court Dates, Parole/Probation? No data recorded Contacted To Inform of Risk of Harm To Self or Others: No data recorded  Location of Assessment: No data recorded  Does Patient Present under Involuntary Commitment? No data recorded IVC Papers Initial File Date: No data recorded  [place] of Residence: No data recorded  Patient Currently Receiving the Following Services: No data recorded  Determination of Need: No data recorded  Options For Referral: No data recorded    CCA Biopsychosocial Intake/Chief Complaint:  Patient  presents to the [facility] as a walk-in seeking help for her depression.  Patient states that she has been depressed since she was an adolescent and she states that she was hospitalized in [place].  Patient states that since her hospitalization that she has not had any follow-up services.  Patient denies current suicidal thoughts, but states that she has experienced them in the past, but states that she has never acted on them.  Patient states that she is currently experiencing a lack of motivation and concentration, her appetite fluctuates  form not eating to over-eating, she states that her sleep is either all day or none at all. Patient denies HI/Psychosis and states that she has no SA issues.  Patient states that she just lacks the ability to cope with her depression and states that she has things that occurred to her throughout her life that hurt her and she states that she has never dealt with them and feels like she needs a therapist.  Patient states that she is always stressed and states that she feels like she does not do enough for her family and herself.  Patient states that her family is also stressed with their finanal situation,  Patient denies any history of abuse or self-mutilation.  Current Symptoms/Problems: depressed mood, decreased concentration, lack of motivation and  decreased appetite and sleep.   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   Type of Services Patient Feels are Needed: Individual counseling and medication management   Initial Clinical Notes/Concerns: No data recorded  Mental Health Symptoms Depression:  Change in energy/activity; Difficulty Concentrating; Increase/decrease in appetite; Sleep (too much or little); Weight  gain/loss   Duration of Depressive symptoms: No data recorded  Mania:  None   Anxiety:   None   Psychosis:  None   Duration of Psychotic symptoms: No data recorded  Trauma:  None   Obsessions:  None   Compulsions:  None   Inattention:  None   Hyperactivity/Impulsivity:  N/A   Oppositional/Defiant Behaviors:  None   Emotional Irregularity:  None   Other Mood/Personality Symptoms:  No data recorded   Mental Status Exam Appearance and self-care  Stature:  Average   Weight:  Average weight   Clothing:  Casual; Neat/clean   Grooming:  Well-groomed   Cosmetic use:  Age appropriate   Posture/gait:  Normal   Motor activity:  Not Remarkable   Sensorium  Attention:  Normal   Concentration:  Preoccupied   Orientation:  Object; Person; Place; Situation; Time   Recall/memory:  Normal   Affect and Mood  Affect:  Depressed   Mood:  Depressed   Relating  Eye contact:  Normal   Facial expression:  Depressed   Attitude toward examiner:  Cooperative   Thought and Language  Speech flow: Clear and Coherent   Thought content:  Appropriate to Mood and Circumstances   Preoccupation:  None   Hallucinations:  None   Organization:  No data recorded  Affiliated Computer ServicesExecutive Functions  Fund of Knowledge:  Good   Intelligence:  Above Average   Abstraction:  Normal   Judgement:  Normal   Reality Testing:  Realistic   Insight:  Good   Decision Making:  Normal   Social Functioning  Social Maturity:  Responsible   Social Judgement:  Normal   Stress  Stressors:  Family conflict; Financial   Coping Ability:  Overwhelmed; Deficient supports   Skill Deficits:  None   Supports:  Family     Religion: Religion/Spirituality Are You A Religious Person?: No How Might This Affect Treatment?: not assessed  Leisure/Recreation: Leisure / Recreation Do You Have Hobbies?: Yes Leisure and Hobbies: singing and dancing  Exercise/Diet: Exercise/Diet Do You Exercise?:  No Have You Gained or Lost A Significant Amount of Weight in the Past Six Months?: No Do You Follow a Special Diet?: No Do You Have Any Trouble Sleeping?: No   CCA Employment/Education Employment/Work Situation: Employment / Work Situation Employment situation: Employed 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[employer] How long has patient been employed?: 2 years Patient's job has been impacted by current illness: No What is the longest time patient has a held a job?: not assessed Where was the patient employed at that time?: not assessed Has patient ever been in the Eli Lilly and Companymilitary?: No  Education: Education Is Patient Currently Attending School?: No Last Grade Completed: 12 Name of [place] [employer] [employer] Did Garment/textile technologistYou Graduate From [employer] School?: Yes Did Theme park managerYou Attend College?: Yes What Type of College Degree Do you Have?: Attended [employer] Did [place] Attend Graduate School?: No Did You Have An Individualized Education  Program (IIEP): No Did You Have Any Difficulty At School?: No Patient's Education Has Been Impacted by Current Illness: No   CCA Family/Childhood History Family and Relationship History: Family history Marital status: Married Number of Years Married:  (not assessed) What types of issues is patient dealing with in the relationship?: Patient states that she feels like her husband also struggles with depression, financial problems Are you sexually active?: Yes What is your sexual orientation?: heterosexual Has your sexual activity been affected by drugs, alcohol, medication, or emotional stress?: N/A Does patient have children?: Yes How many children?: 2 (currently pregnant with third children) How is patient's relationship with their children?: patient states that she ic lose to her children  Childhood History:  Childhood History By whom was/is the patient raised?: Mother Additional childhood history information: parents divorced when she was [age] Description  of patient's relationship with caregiver when they were a child: close to mother, not close to father Patient's description of current relationship with people who raised him/her: close to mother, no relationship with father currently How were you disciplined when you got in trouble as a child/adolescent?: patient states that she was disciplined appropriately Does patient have siblings?: Yes Number of Siblings: 10 Description of patient's current relationship with siblings: patient states that she is relatively close to her siblings Did patient suffer any verbal/emotional/physical/sexual abuse as a child?: No Did patient suffer from severe childhood neglect?: No Has patient ever been sexually abused/assaulted/raped as an adolescent or adult?: No Was the patient ever a victim of a crime or a disaster?: No Witnessed domestic violence?: No Has patient been affected by domestic violence as an adult?: No  Child/Adolescent Assessment:     CCA Substance Use Alcohol/Drug Use: Alcohol / Drug Use Pain Medications: none Prescriptions: none Over the Counter: none History of alcohol / drug use?: No history of alcohol / drug abuse Longest period of sobriety (when/how long): N/A                         ASAM's:  Six Dimensions of Multidimensional Assessment  Dimension 1:  Acute Intoxication and/or Withdrawal Potential:      Dimension 2:  Biomedical Conditions and Complications:      Dimension 3:  Emotional, Behavioral, or Cognitive Conditions and Complications:     Dimension 4:  Readiness to Change:     Dimension 5:  Relapse, Continued use, or Continued Problem Potential:     Dimension 6:  Recovery/Living Environment:     ASAM Severity Score:    ASAM Recommended Level of Treatment:     Substance use Disorder (SUD)    Recommendations for Services/Supports/Treatments:    DSM5 Diagnoses: Patient Active Problem List   Diagnosis Date Noted  . Major depressive disorder, single  episode, severe (HCC)   . Tobacco use during pregnancy, antepartum 11/05/2020  . Obesity during pregnancy 11/05/2020  . COVID-19 affecting pregnancy in first trimester 11/05/2020  . Supervision of other normal pregnancy, antepartum 10/29/2020    Disposition:Per Berneice Heinrich NP, patient does not meet inpatient admission criteria and can follow-up with an OP Provider    Referrals to Alternative Service(s): Referred to Alternative Service(s):   Place:   Date:   Time:    Referred to Alternative Service(s):   Place:   Date:   Time:    Referred to Alternative Service(s):   Place:   Date:   Time:    Referred to Alternative Service(s):   Place:  Date:   Time:     Karan Ramnauth J Hilliary Jock, LCAS

## 2020-12-05 NOTE — ED Triage Notes (Signed)
Patient states she has been dealing with depression for some years. Patient denies SI/HI and A/V/H. Patient states as a child she did have inpatient treatment for depression but none since an adult. She saw a therapist in [place] about 2015 as an adult. Patient has multiple stressors.

## 2020-12-05 NOTE — Discharge Instructions (Addendum)

## 2020-12-05 NOTE — ED Notes (Signed)
Patient discharged home. AVS reviewed with patient and she verbalized understanding. Written copy of AVS given to patient. Patient denies SI/Hi and A/V/H. Patient escorted off unit by staff. All belongings returned to patient at discharged.

## 2020-12-05 NOTE — ED Provider Notes (Signed)
Behavioral Health Urgent Care Medical Screening Exam  Patient Name: Susan Clements MRN: [id number] Date of Evaluation: 12/05/20 Chief Complaint:   Diagnosis:  Final diagnoses:  Severe episode of recurrent major depressive disorder, without psychotic features (HCC)    History of Present illness: Alexxus Abdul-Matin is a 29 y.o. female.  Patient [facility] to [facility] for walk-in assessment.  Patient reports she came in today as she would like to establish outpatient psychiatric care.   Patient reports recent stressors include "trying to cope with everything but I feel like I need something more."  Patient reports she recently relocated from [place] to [place] area approximately 3 months ago.  Patient reports she does have 2 sisters who live in the [place] area.  Patient reports she has been seen by outpatient counseling in the past.  Patient denies any recent outpatient psychiatric follow-up.  Patient denies any medication history related to her mental illness.  Patient resides in [place] with her husband and 2 children ages 39 and 75 years old.  Patient is currently approximately 5 months pregnant.  Patient denies access to weapons.  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  Patient reports she is currently seeking a part-time employer that will provide her with the health insurance resources she needs for her family.  Patient denies both alcohol and substance use.  Patient endorses average sleep and appetite.  Patient endorsed by nurse practitioner.  Patient alert and oriented, answers appropriately.  Patient pleasant cooperative during assessment.  Patient denies suicidal and homicidal ideations.  Patient denies any history of suicide attempts, denies any history of self-harm behaviors.  Patient denies auditory visual hallucinations.  There is no evidence of delusional thought content and no indication that  patient is responding to internal stimuli.  Patient denies symptoms of paranoia.  Patient offered support and encouragement.  Patient gives verbal consent to speak with her husband of the home.  TTS counselor following up with patient's husband.  Psychiatric Specialty Exam  Presentation  General Appearance:Appropriate for Environment; Casual  Eye Contact:Good  Speech:Clear and Coherent; Normal Rate  Speech Volume:Normal  Handedness:Right   Mood and Affect  Mood:Euthymic  Affect:Appropriate; Congruent   Thought Process  Thought Processes:Coherent; Goal Directed  Descriptions of Associations:Intact  Orientation:Full (Time, Place and Person)  Thought Content:Logical; WDL  Hallucinations:None  Ideas of Reference:None  Suicidal Thoughts:No  Homicidal Thoughts:No   Sensorium  Memory:Immediate Good; Recent Good; Remote Good  Judgment:Good  Insight:Good   Executive Functions  Concentration:Good  Attention Span:Good  Recall:Good  Fund of Knowledge:Good  Language:Good   Psychomotor Activity  Psychomotor Activity:Normal   Assets  Assets:Communication Skills; Financial Resources/Insurance; Desire for Improvement; Housing; Intimacy; Leisure Time; Physical Health; Resilience; Social Support; Talents/Skills; Transportation; Vocational/Educational   Sleep  Sleep:Good  Number of hours: No data recorded  Physical Exam: Physical Exam Vitals and nursing note reviewed.  Constitutional:      Appearance: She is well-developed.  HENT:     Head: Normocephalic.  Cardiovascular:     Rate and Rhythm: Normal rate.  Pulmonary:     Effort: Pulmonary effort is normal.  Neurological:     Mental Status: She is alert and oriented to person, place, and time.  Psychiatric:        Attention and Perception: Attention and perception normal.        Mood and Affect: Affect normal. Mood is depressed.        Speech: Speech normal.  Behavior: Behavior normal.  Behavior is cooperative.        Thought Content: Thought content normal.        Cognition and Memory: Cognition and memory normal.        Judgment: Judgment normal.    Review of Systems  Constitutional: Negative.   HENT: Negative.   Eyes: Negative.   Respiratory: Negative.   Cardiovascular: Negative.   Gastrointestinal: Negative.   Genitourinary: Negative.   Musculoskeletal: Negative.   Skin: Negative.   Neurological: Negative.   Endo/Heme/Allergies: Negative.   Psychiatric/Behavioral: Positive for depression.   Blood pressure 118/62, pulse 99, temperature 97.7 F (36.5 C), temperature source Tympanic, resp. rate 18, height 5\' 4"  (1.626 m), weight 252 lb (114.3 kg), last menstrual period 07/27/2020, SpO2 98 %. Body mass index is 43.26 kg/m.  Musculoskeletal: Strength & Muscle Tone: within normal limits Gait & Station: normal Patient leans: N/A   [facility] MSE Discharge Disposition for Follow up and Recommendations: Based on my evaluation the patient does not appear to have an emergency medical condition and can be discharged with resources and follow up care in outpatient services for Medication Management and Individual Therapy  Patient reviewed with Dr. 09/26/2020. Follow-up with outpatient psychiatric resources provided.   Nelly Rout, FNP 12/05/2020, 8:43 AM

## 2020-12-18 NOTE — L&D Delivery Note (Addendum)
Vacuum Assisted Vaginal Delivery Indication for operative vaginal delivery: prolonged deceleration x7-8 minutes  Patient was examined and found to be fully dilated with fetal station of +2.  Patient's bladder was noted to be empty, and there were no known fetal contraindications to operative vaginal delivery.  Risks of vacuum assistance were discussed in detail, including but not limited to, bleeding, infection, damage to maternal tissues, fetal cephalohematoma, inability to effect vaginal delivery of the head or shoulder dystocia that cannot be resolved by established maneuvers and need for emergency cesarean section.  Patient gave verbal consent.  The soft vacuum cup was positioned over the sagittal suture 3 cm anterior to posterior fontanelle.  Pressure was then increased to 500 mmHg, and the patient was instructed to push.  Pulling was administered along the pelvic curve while patient was pushing; there were 1 contractions and 0 popoffs.  Vacuum was reduced in between contractions.  The infant was then delivered atraumatically, noted to be a viable female infant, Apgars of 8 and 9. A 30 second shoulder dystocia was noted at delivery, given infant was direct OA. Delivered with McRoberts and suprapubic pressure.  Neonatology present for delivery.  There was spontaneous placental delivery, intact with three-vessel cord. A post placental liletta was placed, refer to procedure note for further details. Small first degree perineal laceration noted requiring repair with 3-0 Vicryl in the usual fashion. EBL , epidural anesthesia.   Sponge, instrument and needle counts were correct x 2.  The patient and baby were stable after delivery and remained in couplet care, with plans to transfer later to postpartum unit.  Alric Seton, MD OB Fellow, Faculty [facility], Center for [facility] 05/01/2021 3:17 AM

## 2020-12-31 ENCOUNTER — Telehealth (INDEPENDENT_AMBULATORY_CARE_PROVIDER_SITE_OTHER): Payer: Medicaid Other | Admitting: Nurse Practitioner

## 2020-12-31 VITALS — BP 137/85 | HR 105

## 2020-12-31 DIAGNOSIS — O26892 Other specified pregnancy related conditions, second trimester: Secondary | ICD-10-CM

## 2020-12-31 DIAGNOSIS — O99342 Other mental disorders complicating pregnancy, second trimester: Secondary | ICD-10-CM

## 2020-12-31 DIAGNOSIS — F17200 Nicotine dependence, unspecified, uncomplicated: Secondary | ICD-10-CM

## 2020-12-31 DIAGNOSIS — O99330 Smoking (tobacco) complicating pregnancy, unspecified trimester: Secondary | ICD-10-CM

## 2020-12-31 DIAGNOSIS — U071 COVID-19: Secondary | ICD-10-CM

## 2020-12-31 DIAGNOSIS — O99332 Smoking (tobacco) complicating pregnancy, second trimester: Secondary | ICD-10-CM

## 2020-12-31 DIAGNOSIS — Z3A22 22 weeks gestation of pregnancy: Secondary | ICD-10-CM

## 2020-12-31 DIAGNOSIS — Z3480 Encounter for supervision of other normal pregnancy, unspecified trimester: Secondary | ICD-10-CM

## 2020-12-31 DIAGNOSIS — F322 Major depressive disorder, single episode, severe without psychotic features: Secondary | ICD-10-CM

## 2020-12-31 DIAGNOSIS — O98511 Other viral diseases complicating pregnancy, first trimester: Secondary | ICD-10-CM

## 2020-12-31 DIAGNOSIS — O26899 Other specified pregnancy related conditions, unspecified trimester: Secondary | ICD-10-CM

## 2020-12-31 DIAGNOSIS — O98512 Other viral diseases complicating pregnancy, second trimester: Secondary | ICD-10-CM

## 2020-12-31 DIAGNOSIS — F419 Anxiety disorder, unspecified: Secondary | ICD-10-CM | POA: Insufficient documentation

## 2020-12-31 DIAGNOSIS — R12 Heartburn: Secondary | ICD-10-CM

## 2020-12-31 MED ORDER — HYDROXYZINE PAMOATE 25 MG PO CAPS: 25.0000 mg | ORAL_CAPSULE | Freq: Three times a day (TID) | ORAL | 0 refills | Status: DC | PRN

## 2020-12-31 NOTE — Progress Notes (Signed)
OBSTETRICS PRENATAL VIRTUAL VISIT ENCOUNTER NOTE  Provider location: Center for [facility]   I connected with Susan Clements on 12/31/20 at  8:10 AM EST by MyChart Video Encounter at home and verified that I am speaking with the correct person using two identifiers.   I discussed the limitations, risks, security and privacy concerns of performing an evaluation and management service virtually and the availability of in person appointments. I also discussed with the patient that there may be a patient responsible charge related to this service. The patient expressed understanding and agreed to proceed. Subjective:  Susan Clements is a 30 y.o. [id number] at [redacted]w[redacted]d being seen today for ongoing prenatal care.  She is currently monitored for the following issues for this low-risk pregnancy and has Supervision of other normal pregnancy, antepartum; Tobacco use during pregnancy, antepartum; Obesity during pregnancy; COVID-19 affecting pregnancy in first trimester; Major depressive disorder, single episode, severe (HCC); Heartburn during pregnancy, antepartum; and Anxiety on their problem list.  Patient reports numbness in hands and toes - periodic.  Contractions: Not present. Vag. Bleeding: None.  Movement: Present. Denies any leaking of fluid.   The following portions of the patient's history were reviewed and updated as appropriate: allergies, current medications, past family history, past medical history, past social history, past surgical history and problem list.   Objective:   Vitals:   12/31/20 0816  BP: 137/85  Pulse: (!) 105    Fetal Status:     Movement: Present     General:  Alert, oriented and cooperative. Patient is in no acute distress.  Respiratory: Normal respiratory effort, no problems with respiration noted  Mental Status: Normal mood and affect. Normal behavior. Normal judgment and thought content.  Rest of physical exam deferred due to type of  encounter  Imaging: No results found.  Assessment and Plan:  Pregnancy: [id number] at [redacted]w[redacted]d 1. Supervision of other normal pregnancy, antepartum Nurse reviewed how to wear maternity support belt as feeing pelvic pressure Drink at least 8 8-oz glasses of water every day to keep well hydrated. Reviewed virtual visits until glucose testing done. Noticing some numbness in toes and fingers.  Has known carpal tunnel in one hand and did not realize that fluid retention in pregnancy can cause increased problems with numbness Has not had anatomy US yet - will get scheduled Has better understanding of how to wear the pregnancy support belt. Reviewed how to take her BP and advised to take weekly and add to Bab[employer] app.  Reviewed high BP as 140/90 (either value). Will need to monitor BP as higher today than previous visits.  2. COVID-19 affecting pregnancy in first trimester Had in Sept.  Doing well now.  Wants to get Covid vaccine - advised to make an appointment at [facility] or [facility].  3. Heartburn during pregnancy, antepartum Taking Tums now - reassured it is safe If Tums does not help her problem, notify the office so additional medication can be prescribed.  4. Tobacco use during pregnancy, antepartum Has decreased smoking from 1ppd to 1/2 ppd. Advised to continue to decrease. Is using nicotine patches, not daily.  5. Anxiety Is applying for work from home jobs.  Has moved to [place] several months ago.  Has 2 children at home.  Has a history of depression in previous pregnancies and postpartum but has never taken medication for the depression.  6. Major depressive disorder, single episode, severe (HCC) Went to [facility] last month - walked in for a  visit.  Has not been able to follow up to arrange for counseling but is interested.  Will refer for Sue Lush to meet with her.  Client very interested in this follow up.  Preterm labor symptoms and general obstetric precautions  including but not limited to vaginal bleeding, contractions, leaking of fluid and fetal movement were reviewed in detail with the patient. I discussed the assessment and treatment plan with the patient. The patient was provided an opportunity to ask questions and all were answered. The patient agreed with the plan and demonstrated an understanding of the instructions. The patient was advised to call back or seek an in-person office evaluation/go to MAU at [facility] for any urgent or concerning symptoms. Please refer to After Visit Summary for other counseling recommendations.   I provided 8 minutes of face-to-face time during this encounter.  Return in about 4 weeks (around 01/28/2021) for Virtual ROB.   Currie Paris, NP Center for [employer], [facility]

## 2020-12-31 NOTE — Progress Notes (Signed)
I connected with  Susan Clements on 12/31/20 by a video enabled telemedicine application and verified that I am speaking with the correct person using two identifiers.   I discussed the limitations of evaluation and management by telemedicine. The patient expressed understanding and agreed to proceed.   [facility] OB, c/o numbness in hands, pressure. Instructions given on the proper use of Maternity belt.

## 2021-01-10 ENCOUNTER — Encounter (HOSPITAL_COMMUNITY): Payer: Self-pay | Admitting: Family Medicine

## 2021-01-10 ENCOUNTER — Telehealth: Payer: Self-pay | Admitting: Licensed Clinical Social Worker

## 2021-01-10 ENCOUNTER — Inpatient Hospital Stay (HOSPITAL_COMMUNITY)
Admission: AD | Admit: 2021-01-10 | Discharge: 2021-01-10 | Disposition: A | Discharge status: Home / Self Care | Payer: Medicaid Other | Attending: Family Medicine | Admitting: Family Medicine

## 2021-01-10 ENCOUNTER — Encounter: Payer: Medicaid Other | Admitting: Licensed Clinical Social Worker

## 2021-01-10 ENCOUNTER — Other Ambulatory Visit: Payer: Self-pay

## 2021-01-10 DIAGNOSIS — O99891 Other specified diseases and conditions complicating pregnancy: Secondary | ICD-10-CM | POA: Diagnosis not present

## 2021-01-10 DIAGNOSIS — O26892 Other specified pregnancy related conditions, second trimester: Secondary | ICD-10-CM | POA: Diagnosis not present

## 2021-01-10 DIAGNOSIS — R42 Dizziness and giddiness: Secondary | ICD-10-CM | POA: Insufficient documentation

## 2021-01-10 DIAGNOSIS — Z20822 Contact with and (suspected) exposure to covid-19: Secondary | ICD-10-CM | POA: Insufficient documentation

## 2021-01-10 DIAGNOSIS — O99612 Diseases of the digestive system complicating pregnancy, second trimester: Secondary | ICD-10-CM

## 2021-01-10 DIAGNOSIS — Z87891 Personal history of nicotine dependence: Secondary | ICD-10-CM | POA: Insufficient documentation

## 2021-01-10 DIAGNOSIS — Z3A23 23 weeks gestation of pregnancy: Secondary | ICD-10-CM

## 2021-01-10 DIAGNOSIS — R519 Headache, unspecified: Secondary | ICD-10-CM | POA: Diagnosis not present

## 2021-01-10 DIAGNOSIS — K219 Gastro-esophageal reflux disease without esophagitis: Secondary | ICD-10-CM | POA: Insufficient documentation

## 2021-01-10 DIAGNOSIS — R030 Elevated blood-pressure reading, without diagnosis of hypertension: Secondary | ICD-10-CM | POA: Diagnosis not present

## 2021-01-10 DIAGNOSIS — R197 Diarrhea, unspecified: Secondary | ICD-10-CM | POA: Diagnosis not present

## 2021-01-10 DIAGNOSIS — Z79899 Other long term (current) drug therapy: Secondary | ICD-10-CM | POA: Insufficient documentation

## 2021-01-10 DIAGNOSIS — Z886 Allergy status to analgesic agent status: Secondary | ICD-10-CM | POA: Insufficient documentation

## 2021-01-10 DIAGNOSIS — O2652 Maternal hypotension syndrome, second trimester: Secondary | ICD-10-CM

## 2021-01-10 DIAGNOSIS — I959 Hypotension, unspecified: Secondary | ICD-10-CM

## 2021-01-10 LAB — CBC WITH DIFFERENTIAL/PLATELET
Abs Immature Granulocytes: 0.07 10*3/uL (ref 0.00–0.07)
Basophils Absolute: 0 10*3/uL (ref 0.0–0.1)
Basophils Relative: 1 %
Eosinophils Absolute: 0.2 10*3/uL (ref 0.0–0.5)
Eosinophils Relative: 3 %
HCT: 30.9 % — ABNORMAL LOW (ref 36.0–46.0)
Hemoglobin: 10.6 g/dL — ABNORMAL LOW (ref 12.0–15.0)
Immature Granulocytes: 1 %
Lymphocytes Relative: 26 %
Lymphs Abs: 1.6 10*3/uL (ref 0.7–4.0)
MCH: 32.1 pg (ref 26.0–34.0)
MCHC: 34.3 g/dL (ref 30.0–36.0)
MCV: 93.6 fL (ref 80.0–100.0)
Monocytes Absolute: 0.5 10*3/uL (ref 0.1–1.0)
Monocytes Relative: 7 %
Neutro Abs: 3.9 10*3/uL (ref 1.7–7.7)
Neutrophils Relative %: 62 %
Platelets: 269 10*3/uL (ref 150–400)
RBC: 3.3 MIL/uL — ABNORMAL LOW (ref 3.87–5.11)
RDW: 11.8 % (ref 11.5–15.5)
WBC: 6.3 10*3/uL (ref 4.0–10.5)
nRBC: 0 % (ref 0.0–0.2)

## 2021-01-10 LAB — URINALYSIS, ROUTINE W REFLEX MICROSCOPIC
Bilirubin Urine: NEGATIVE
Glucose, UA: NEGATIVE mg/dL
Hgb urine dipstick: NEGATIVE
Ketones, ur: NEGATIVE mg/dL
Leukocytes,Ua: NEGATIVE
Nitrite: NEGATIVE
Protein, ur: NEGATIVE mg/dL
Specific Gravity, Urine: 1.016 (ref 1.005–1.030)
pH: 6 (ref 5.0–8.0)

## 2021-01-10 LAB — SARS CORONAVIRUS 2 BY RT PCR (HOSPITAL ORDER, PERFORMED IN ~~LOC~~ HOSPITAL LAB): SARS Coronavirus 2: NEGATIVE

## 2021-01-10 MED ORDER — LACTATED RINGERS IV BOLUS
1000.0000 mL | Freq: Once | INTRAVENOUS | Status: AC
  Administered 2021-01-10: 1000 mL via INTRAVENOUS

## 2021-01-10 MED ORDER — FAMOTIDINE 20 MG PO TABS
40.0000 mg | ORAL_TABLET | Freq: Once | ORAL | Status: AC
  Administered 2021-01-10: 40 mg via ORAL
  Filled 2021-01-10: qty 2

## 2021-01-10 MED ORDER — FAMOTIDINE 20 MG PO TABS: 20.0000 mg | ORAL_TABLET | Freq: Two times a day (BID) | ORAL | 1 refills | Status: DC

## 2021-01-10 NOTE — Telephone Encounter (Signed)
Called pt regarding scheduled mychart visit. Left detailed message for callback  

## 2021-01-10 NOTE — MAU Note (Signed)
Pt states she woke up and was having some dizzyness. No Headache but has been having headache on and off. Reprots fetal movement a little less today. And c/o some stomach upset.

## 2021-01-10 NOTE — MAU Provider Note (Cosign Needed Addendum)
History     CSN: [id number]  Arrival date and time: 01/10/21 1526   None     Chief Complaint  Patient presents with  . Hypertension   HPI   Susan Clements is a 30 y.o. F [id number] at 18w6dwho presents with complaints of hypertension.  She started having R sided pulsatile "tension" headaches 2-3 days ago and took tylenol for some relief. She did not take her BP at that time. This morning, patient took her BP with home cuff twice with readings greater than 140/90.This morning patient reports dizziness and stomach discomfort with some diarrhea around 10am. Patient reports one episode of diarrhea, but denies nausea and vomiting. Husband and patient decided it would be best to be assessed in the ED.   OB History     Gravida  5   Para  2   Term  2   Preterm      AB  2   Living  2      SAB      IAB  2   Ectopic      Multiple      Live Births  2           Past Medical History:  Diagnosis Date  . Asthma     Past Surgical History:  Procedure Laterality Date  . NO PAST SURGERIES      Family History  Problem Relation Age of Onset  . Healthy Mother   . Diabetes Father   . Throat cancer Maternal Grandmother   . Hypertension Maternal Grandmother   . Diabetes Maternal Grandmother   . Heart failure Maternal Grandmother   . Hypertension Paternal Grandmother     Social History   Tobacco Use  . Smoking status: Former Smoker    Packs/day: 0.25    Types: Cigars, Cigarettes    Quit date: 09/10/2020    Years since quitting: 0.3  . Smokeless tobacco: Never Used  . Tobacco comment: on patch  Vaping Use  . Vaping Use: Never used  Substance Use Topics  . Alcohol use: Not Currently    Comment: last drink was 1 month ago  . Drug use: Not Currently    Allergies:  Allergies  Allergen Reactions  . Nsaids     Medications Prior to Admission  Medication Sig Dispense Refill Last Dose  . albuterol (VENTOLIN HFA) 108 (90 Base) MCG/ACT inhaler Inhale 2 puffs  into the lungs every 6 (six) hours as needed for wheezing or shortness of breath. 8 g 2 Past Week at Unknown time  . Ascorbic Acid (VITAMIN C) 1000 MG tablet Take 1,000 mg by mouth daily.   01/09/2021 at Unknown time  . hydrOXYzine (VISTARIL) 25 MG capsule Take 1 capsule (25 mg total) by mouth 3 (three) times daily as needed. 30 capsule 0 01/09/2021 at Unknown time  . nicotine (NICODERM CQ - DOSED IN MG/24 HOURS) 14 mg/24hr patch Place 1 patch (14 mg total) onto the skin daily. 28 patch 1 01/10/2021 at Unknown time  . Prenatal Vit-Fe Fumarate-FA (PREPLUS) 27-1 MG TABS Take 1 tablet by mouth daily. 30 tablet 13 01/09/2021 at Unknown time  . zinc gluconate 50 MG tablet Take 22 mg by mouth daily.   01/09/2021 at Unknown time  . Blood Pressure Monitoring (BLOOD PRESSURE KIT) DEVI 1 kit by Does not apply route once a week. 1 each 0   . Elastic Bandages & Supports (COMFORT FIT MATERNITY SUPP LG) MISC 1 each by Does not apply  route daily as needed. 1 each 0    Results for orders placed or performed during the hospital encounter of 01/10/21 (from the past 48 hour(s))  Urinalysis, Routine w reflex microscopic Urine, Clean Catch     Status: Abnormal   Collection Time: 01/10/21  4:16 PM  Result Value Ref Range   Color, Urine YELLOW YELLOW   APPearance HAZY (A) CLEAR   Specific Gravity, Urine 1.016 1.005 - 1.030   pH 6.0 5.0 - 8.0   Glucose, UA NEGATIVE NEGATIVE mg/dL   Hgb urine dipstick NEGATIVE NEGATIVE   Bilirubin Urine NEGATIVE NEGATIVE   Ketones, ur NEGATIVE NEGATIVE mg/dL   Protein, ur NEGATIVE NEGATIVE mg/dL   Nitrite NEGATIVE NEGATIVE   Leukocytes,Ua NEGATIVE NEGATIVE    Comment: Performed at [facility] [address] [place], [place] [address]  CBC with Differential/Platelet     Status: Abnormal   Collection Time: 01/10/21  4:28 PM  Result Value Ref Range   WBC 6.3 4.0 - 10.5 K/uL   RBC 3.30 (L) 3.87 - 5.11 MIL/uL   Hemoglobin 10.6 (L) 12.0 - 15.0 g/dL   HCT 30.9 (L) 36.0 - 46.0 %    MCV 93.6 80.0 - 100.0 fL   MCH 32.1 26.0 - 34.0 pg   MCHC 34.3 30.0 - 36.0 g/dL   RDW 11.8 11.5 - 15.5 %   Platelets 269 150 - 400 K/uL   nRBC 0.0 0.0 - 0.2 %   Neutrophils Relative % 62 %   Neutro Abs 3.9 1.7 - 7.7 K/uL   Lymphocytes Relative 26 %   Lymphs Abs 1.6 0.7 - 4.0 K/uL   Monocytes Relative 7 %   Monocytes Absolute 0.5 0.1 - 1.0 K/uL   Eosinophils Relative 3 %   Eosinophils Absolute 0.2 0.0 - 0.5 K/uL   Basophils Relative 1 %   Basophils Absolute 0.0 0.0 - 0.1 K/uL   Immature Granulocytes 1 %   Abs Immature Granulocytes 0.07 0.00 - 0.07 K/uL    Comment: Performed at [facility], [address], [place], [place] [address]  SARS Coronavirus 2 by RT PCR (hospital order, performed in [facility] hospital lab) Nasopharyngeal Nasopharyngeal Swab     Status: None   Collection Time: 01/10/21  4:51 PM   Specimen: Nasopharyngeal Swab  Result Value Ref Range   SARS Coronavirus 2 NEGATIVE NEGATIVE    Comment: (NOTE) SARS-CoV-2 target nucleic acids are NOT DETECTED.  The SARS-CoV-2 RNA is generally detectable in upper and lower respiratory specimens during the acute phase of infection. The lowest concentration of SARS-CoV-2 viral copies this assay can detect is 250 copies / mL. A negative result does not preclude SARS-CoV-2 infection and should not be used as the sole basis for treatment or other patient management decisions.  A negative result may occur with improper specimen collection / handling, submission of specimen other than nasopharyngeal swab, presence of viral mutation(s) within the areas targeted by this assay, and inadequate number of viral copies (<250 copies / mL). A negative result must be combined with clinical observations, patient history, and epidemiological information.  Fact Sheet for Patients:   StrictlyIdeas.no  Fact Sheet for Healthcare Providers: BankingDealers.co.za  This test is not yet  approved or  cleared by the [place] FDA and has been authorized for detection and/or diagnosis of SARS-CoV-2 by FDA under an Emergency Use Authorization (EUA).  This EUA will remain in effect (meaning this test can be used) for the duration of  the COVID-19 declaration under Section 564(b)(1) of the Act, 21 U.S.C. section 360bbb-3(b)(1), unless the authorization is terminated or revoked sooner.  Performed at [facility], [place] [address], [place], [place] [address]    Review of Systems  Gastrointestinal: Positive for abdominal pain (general discomfort) and diarrhea. Negative for nausea and vomiting.  Neurological: Positive for dizziness and light-headedness.   Physical Exam   Blood pressure (!) 109/54, pulse 93, resp. rate 18, height _0  (1.626 m), last menstrual period 07/27/2020.  Physical Exam Constitutional:      General: She is not in acute distress.    Appearance: Normal appearance. She is not ill-appearing or toxic-appearing.  Abdominal:     General: There is no distension.     Tenderness: There is no abdominal tenderness. There is no guarding or rebound.  Musculoskeletal:        General: Normal range of motion.  Neurological:     General: No focal deficit present.     Mental Status: She is alert and oriented to person, place, and time.  Psychiatric:        Mood and Affect: Mood normal.        Behavior: Behavior normal.   Cervical exam: cervix is closed, thick, posterior  MAU Course  Procedures  MDM   Assessment and Plan    Vic Blackbird 01/10/2021, 4:07 PM    PA student attestation:  I have seen and examined this patient and agree with above documentation in the PAs note.   Susan Clements is a 30 y.o. [id number] female reporting hypertension; reports elevated BP x 2 with home cuff. She reports HA yesterday, however no HA today. She reports dizziness with standing. The dizziness started around the time she had diarrhea. She feels over all  like her tummy is upset. She reports diarrhea x 1, + nausea, no vomiting. Headache was tension like and resolved with tylenol.   Associated symptoms:  Negative fever and chills Negative abdominal pain Negative vaginal bleeding Negative vaginal discharge Negative urinary complaints Negative GI complaints  PE: Patient Vitals for the past 24 hrs:  BP Pulse Resp SpO2 Height  01/10/21 2031 (!) 103/59 88 - - -  01/10/21 2028 115/63 91 - - -  01/10/21 2027 127/63 82 - - -  01/10/21 2025 124/62 76 - - -  01/10/21 1846 (!) 136/101 72 - - -  01/10/21 1801 128/60 79 - - -  01/10/21 1746 122/75 83 - - -  01/10/21 1731 115/64 84 - - -  01/10/21 1716 (!) 121/51 82 - - -  01/10/21 1701 100/60 (!) 106 - - -  01/10/21 1658 (!) 109/36 100 - - -  01/10/21 1656 123/61 87 - - -  01/10/21 1655 (!) 120/59 89 - 98 % -  01/10/21 1631 (!) 111/58 81 - - -  01/10/21 1628 (!) 103/55 84 - - -  01/10/21 1616 (!) 83/59 (!) 108 - - -  01/10/21 1601 (!) 124/54 95 - - -  01/10/21 1549 (!) 109/54 93 18 - _1  (1.626 m)   Gen: calm comfortable, NAD Resp: normal effort, no distress Heart: Regular rate Abd: Soft, NT, Pos BS x 4 Neuro: A&O x 4 Pelvic exam: Dilation: Closed Effacement (%): Thick Cervical Position: Posterior Exam by:: J.Thalya Fouche,NP  ROS, labs, PMH reviewed  Orders Placed This Encounter  Procedures  . SARS Coronavirus 2 by RT PCR (hospital order, performed in [facility] hospital lab) Nasopharyngeal Nasopharyngeal Swab  . Urinalysis, Routine  w reflex microscopic Urine, Clean Catch  . CBC with Differential/Platelet  . Orthostatic vital signs  . Airborne and Contact precautions  . Discharge patient   Meds ordered this encounter  Medications  . lactated ringers bolus 1,000 mL  . famotidine (PEPCID) tablet 40 mg  . famotidine (PEPCID) 20 MG tablet    Sig: Take 1 tablet (20 mg total) by mouth 2 (two) times daily.    Dispense:  60 tablet    Refill:  1    Order Specific Question:    Supervising Provider    Answer:   Truett Mainland [[id number]]    MDM  + orthostatic hypotension Lr Bolus X 1 & Pepcid Orthostatics improved and WNL following bolus  Covid test negative today.  CBC with stable hgb   Assessment 1. Gastroesophageal reflux disease, unspecified whether esophagitis present   2. Diarrhea, unspecified type   3. Hypotension, unspecified hypotension type   4. [redacted] weeks gestation of pregnancy     Plan: - Discharge home in stable condition. - Return precautions.  - Follow-up as scheduled at your doctor's office or sooner as needed if symptoms worsen. - Return to maternity admissions symptoms worsen - Rx: Pepcid  Dan Scearce, Artist Pais, NP 01/10/2021 10:16 PM

## 2021-01-10 NOTE — Discharge Instructions (Signed)
Food Choices for Gastroesophageal Reflux Disease, Adult When you have gastroesophageal reflux disease (GERD), the foods you eat and your eating habits are very important. Choosing the right foods can help ease your discomfort. Think about working with a food expert (dietitian) to help you make good choices. What are tips for following this plan? Reading food labels  Look for foods that are low in saturated fat. Foods that may help with your symptoms include: ? Foods that have less than 5% of daily value (DV) of fat. ? Foods that have 0 grams of trans fat. Cooking  Do not fry your food.  Cook your food by baking, steaming, grilling, or broiling. These are all methods that do not need a lot of fat for cooking.  To add flavor, try to use herbs that are low in spice and acidity. Meal planning  Choose healthy foods that are low in fat, such as: ? Fruits and vegetables. ? Whole grains. ? Low-fat dairy products. ? Lean meats, fish, and poultry.  Eat small meals often instead of eating 3 large meals each day. Eat your meals slowly in a place where you are relaxed. Avoid bending over or lying down until 2-3 hours after eating.  Limit high-fat foods such as fatty meats or fried foods.  Limit your intake of fatty foods, such as oils, butter, and shortening.  Avoid the following as told by your doctor: ? Foods that cause symptoms. These may be different for different people. Keep a food diary to keep track of foods that cause symptoms. ? Alcohol. ? Drinking a lot of liquid with meals. ? Eating meals during the 2-3 hours before bed.   Lifestyle  Stay at a healthy weight. Ask your doctor what weight is healthy for you. If you need to lose weight, work with your doctor to do so safely.  Exercise for at least 30 minutes on 5 or more days each week, or as told by your doctor.  Wear loose-fitting clothes.  Do not smoke or use any products that contain nicotine or tobacco. If you need help  quitting, ask your doctor.  Sleep with the head of your bed higher than your feet. Use a wedge under the mattress or blocks under the bed frame to raise the head of the bed.  Chew sugar-free gum after meals. What foods should eat? Eat a healthy, well-balanced diet of fruits, vegetables, whole grains, low-fat dairy products, lean meats, fish, and poultry. Each person is different. Foods that may cause symptoms in one person may not cause any symptoms in another person. Work with your doctor to find foods that are safe for you. The items listed above may not be a complete list of what you can eat and drink. Contact a food expert for more options.   What foods should I avoid? Limiting some of these foods may help in managing the symptoms of GERD. Everyone is different. Talk with a food expert or your doctor to help you find the exact foods to avoid, if any. Fruits Any fruits prepared with added fat. Any fruits that cause symptoms. For some people, this may include citrus fruits, such as oranges, grapefruit, pineapple, and lemons. Vegetables Deep-fried vegetables. French fries. Any vegetables prepared with added fat. Any vegetables that cause symptoms. For some people, this may include tomatoes and tomato products, chili peppers, onions and garlic, and horseradish. Grains Pastries or quick breads with added fat. Meats and other proteins High-fat meats, such as fatty beef or pork,   hot dogs, ribs, ham, sausage, salami, and bacon. Fried meat or protein, including fried fish and fried chicken. Nuts and nut butters, in large amounts. Dairy Whole milk and chocolate milk. Sour cream. Cream. Ice cream. Cream cheese. Milkshakes. Fats and oils Butter. Margarine. Shortening. Ghee. Beverages Coffee and tea, with or without caffeine. Carbonated beverages. Sodas. Energy drinks. Fruit juice made with acidic fruits, such as orange or grapefruit. Tomato juice. Alcoholic drinks. Sweets and desserts Chocolate and  cocoa. Donuts. Seasonings and condiments Pepper. Peppermint and spearmint. Added salt. Any condiments, herbs, or seasonings that cause symptoms. For some people, this may include curry, hot sauce, or vinegar-based salad dressings. The items listed above may not be a complete list of what you should not eat and drink. Contact a food expert for more options. Questions to ask your doctor Diet and lifestyle changes are often the first steps that are taken to manage symptoms of GERD. If diet and lifestyle changes do not help, talk with your doctor about taking medicines. Where to find more information  International Foundation for Gastrointestinal Disorders: aboutgerd.org Summary  When you have GERD, food and lifestyle choices are very important in easing your symptoms.  Eat small meals often instead of 3 large meals a day. Eat your meals slowly and in a place where you are relaxed.  Avoid bending over or lying down until 2-3 hours after eating.  Limit high-fat foods such as fatty meats or fried foods. This information is not intended to replace advice given to you by your health care provider. Make sure you discuss any questions you have with your health care provider. Document Revised: 06/14/2020 Document Reviewed: 06/14/2020 Elsevier Patient Education  2021 [employer]  

## 2021-01-13 ENCOUNTER — Ambulatory Visit: Payer: Medicaid Other | Admitting: *Deleted

## 2021-01-13 ENCOUNTER — Other Ambulatory Visit: Payer: Self-pay

## 2021-01-13 ENCOUNTER — Other Ambulatory Visit: Payer: Self-pay | Admitting: *Deleted

## 2021-01-13 ENCOUNTER — Ambulatory Visit: Payer: Medicaid Other | Attending: Certified Nurse Midwife

## 2021-01-13 ENCOUNTER — Encounter: Payer: Self-pay | Admitting: *Deleted

## 2021-01-13 ENCOUNTER — Inpatient Hospital Stay (HOSPITAL_COMMUNITY)
Admission: AD | Admit: 2021-01-13 | Discharge: 2021-01-14 | Disposition: A | Discharge status: Home / Self Care | Payer: Medicaid Other | Attending: Obstetrics and Gynecology | Admitting: Obstetrics and Gynecology

## 2021-01-13 ENCOUNTER — Encounter (HOSPITAL_COMMUNITY): Payer: Self-pay | Admitting: Obstetrics and Gynecology

## 2021-01-13 DIAGNOSIS — Z6837 Body mass index (BMI) 37.0-37.9, adult: Secondary | ICD-10-CM

## 2021-01-13 DIAGNOSIS — O99412 Diseases of the circulatory system complicating pregnancy, second trimester: Secondary | ICD-10-CM | POA: Insufficient documentation

## 2021-01-13 DIAGNOSIS — Z3480 Encounter for supervision of other normal pregnancy, unspecified trimester: Secondary | ICD-10-CM

## 2021-01-13 DIAGNOSIS — O99282 Endocrine, nutritional and metabolic diseases complicating pregnancy, second trimester: Secondary | ICD-10-CM | POA: Insufficient documentation

## 2021-01-13 DIAGNOSIS — Z886 Allergy status to analgesic agent status: Secondary | ICD-10-CM | POA: Insufficient documentation

## 2021-01-13 DIAGNOSIS — O99342 Other mental disorders complicating pregnancy, second trimester: Secondary | ICD-10-CM | POA: Diagnosis not present

## 2021-01-13 DIAGNOSIS — E876 Hypokalemia: Secondary | ICD-10-CM | POA: Diagnosis not present

## 2021-01-13 DIAGNOSIS — O98511 Other viral diseases complicating pregnancy, first trimester: Secondary | ICD-10-CM | POA: Insufficient documentation

## 2021-01-13 DIAGNOSIS — Z8249 Family history of ischemic heart disease and other diseases of the circulatory system: Secondary | ICD-10-CM | POA: Insufficient documentation

## 2021-01-13 DIAGNOSIS — U071 COVID-19: Secondary | ICD-10-CM | POA: Diagnosis present

## 2021-01-13 DIAGNOSIS — K219 Gastro-esophageal reflux disease without esophagitis: Secondary | ICD-10-CM | POA: Diagnosis not present

## 2021-01-13 DIAGNOSIS — F419 Anxiety disorder, unspecified: Secondary | ICD-10-CM | POA: Diagnosis not present

## 2021-01-13 DIAGNOSIS — Z79899 Other long term (current) drug therapy: Secondary | ICD-10-CM | POA: Diagnosis not present

## 2021-01-13 DIAGNOSIS — O99332 Smoking (tobacco) complicating pregnancy, second trimester: Secondary | ICD-10-CM | POA: Insufficient documentation

## 2021-01-13 DIAGNOSIS — O99612 Diseases of the digestive system complicating pregnancy, second trimester: Secondary | ICD-10-CM | POA: Insufficient documentation

## 2021-01-13 DIAGNOSIS — I499 Cardiac arrhythmia, unspecified: Secondary | ICD-10-CM | POA: Diagnosis present

## 2021-01-13 DIAGNOSIS — Z3A24 24 weeks gestation of pregnancy: Secondary | ICD-10-CM | POA: Diagnosis not present

## 2021-01-13 DIAGNOSIS — O99330 Smoking (tobacco) complicating pregnancy, unspecified trimester: Secondary | ICD-10-CM | POA: Diagnosis not present

## 2021-01-13 DIAGNOSIS — F17290 Nicotine dependence, other tobacco product, uncomplicated: Secondary | ICD-10-CM | POA: Diagnosis not present

## 2021-01-13 LAB — BASIC METABOLIC PANEL
Anion gap: 13 (ref 5–15)
BUN: 5 mg/dL — ABNORMAL LOW (ref 6–20)
CO2: 20 mmol/L — ABNORMAL LOW (ref 22–32)
Calcium: 9.7 mg/dL (ref 8.9–10.3)
Chloride: 102 mmol/L (ref 98–111)
Creatinine, Ser: 0.45 mg/dL (ref 0.44–1.00)
GFR, Estimated: >60 mL/min (ref >60)
Glucose, Bld: 89 mg/dL (ref 70–99)
Potassium: 3.3 mmol/L — ABNORMAL LOW (ref 3.5–5.1)
Sodium: 135 mmol/L (ref 135–145)

## 2021-01-13 MED ORDER — ALUM & MAG HYDROXIDE-SIMETH 200-200-20 MG/5ML PO SUSP
30.0000 mL | Freq: Once | ORAL | Status: AC
  Administered 2021-01-13: 30 mL via ORAL
  Filled 2021-01-13: qty 30

## 2021-01-13 MED ORDER — LIDOCAINE VISCOUS HCL 2 % MT SOLN
15.0000 mL | Freq: Once | OROMUCOSAL | Status: AC
  Administered 2021-01-13: 15 mL via ORAL
  Filled 2021-01-13: qty 15

## 2021-01-13 NOTE — MAU Provider Note (Addendum)
History     CSN: [id number]  Arrival date and time: 01/13/21 2201   Event Date/Time   First Provider Initiated Contact with Patient 01/13/21 2241      Chief Complaint  Patient presents with  . Irregular Heart Beat   HPI Susan Clements is a 30 y.o. O0H2122 at [redacted]w[redacted]d who presents to MAU with chief complaint of "irregular heartbeat". This is a new problem, onset 2-3 hours ago.  She was standing at home, cooking and cleaning when her symptoms started. Patient denies chest pain, palpitations, weakness, syncope, SOB. She denies person or family history of cardiac concerns.  Patient endorses hx of acid reflux, which she manages with Tums. She states Tums have never provided relief for her.   Patient also endorses history of anxiety which she manages with Vistaril.  She denies abdominal pain, vaginal bleeding, dysuria, fever or recent illness. She receives care with [facility].  OB History    Gravida  5   Para  2   Term  2   Preterm      AB  2   Living  2     SAB      IAB  2   Ectopic      Multiple      Live Births  2           Past Medical History:  Diagnosis Date  . Asthma     Past Surgical History:  Procedure Laterality Date  . NO PAST SURGERIES      Family History  Problem Relation Age of Onset  . Healthy Mother   . Diabetes Father   . Throat cancer Maternal Grandmother   . Hypertension Maternal Grandmother   . Diabetes Maternal Grandmother   . Heart failure Maternal Grandmother   . Hypertension Paternal Grandmother     Social History   Tobacco Use  . Smoking status: Current Every Day Smoker    Packs/day: 0.25    Types: Cigars, Cigarettes    Last attempt to quit: 09/10/2020    Years since quitting: 0.3  . Smokeless tobacco: Never Used  . Tobacco comment: on patch  Vaping Use  . Vaping Use: Never used  Substance Use Topics  . Alcohol use: Not Currently    Comment: last drink was 1 month ago  . Drug use: Not Currently     Allergies:  Allergies  Allergen Reactions  . Nsaids     Medications Prior to Admission  Medication Sig Dispense Refill Last Dose  . Prenatal Vit-Fe Fumarate-FA (PREPLUS) 27-1 MG TABS Take 1 tablet by mouth daily. 30 tablet 13 01/13/2021 at Unknown time  . albuterol (VENTOLIN HFA) 108 (90 Base) MCG/ACT inhaler Inhale 2 puffs into the lungs every 6 (six) hours as needed for wheezing or shortness of breath. 8 g 2   . Ascorbic Acid (VITAMIN C) 1000 MG tablet Take 1,000 mg by mouth daily.     . Blood Pressure Monitoring (BLOOD PRESSURE KIT) DEVI 1 kit by Does not apply route once a week. 1 each 0   . Elastic Bandages & Supports (COMFORT FIT MATERNITY SUPP LG) MISC 1 each by Does not apply route daily as needed. 1 each 0   . famotidine (PEPCID) 20 MG tablet Take 1 tablet (20 mg total) by mouth 2 (two) times daily. 60 tablet 1   . hydrOXYzine (VISTARIL) 25 MG capsule Take 1 capsule (25 mg total) by mouth 3 (three) times daily as needed. 30 capsule 0   .  nicotine (NICODERM CQ - DOSED IN MG/24 HOURS) 14 mg/24hr patch Place 1 patch (14 mg total) onto the skin daily. 28 patch 1   . zinc gluconate 50 MG tablet Take 22 mg by mouth daily.       Review of Systems  Respiratory: Negative for chest tightness and shortness of breath.   Cardiovascular: Negative for palpitations.       "Irregular heart beat"  Gastrointestinal: Negative for abdominal pain.  Genitourinary: Negative for vaginal bleeding.  Musculoskeletal: Negative for back pain.  All other systems reviewed and are negative.  Physical Exam   Blood pressure 117/65, pulse 80, temperature 98.4 F (36.9 C), resp. rate 18, height $RemoveBe'5\' 4"'vQfBfuJuP$  (1.626 m), weight 116.6 kg, last menstrual period 07/27/2020, SpO2 98 %.  Physical Exam Vitals and nursing note reviewed. Exam conducted with a chaperone present.  Constitutional:      General: She is in acute distress.     Appearance: Normal appearance. She is obese. She is not ill-appearing.   Cardiovascular:     Rate and Rhythm: Normal rate and regular rhythm.     Pulses: Normal pulses.     Heart sounds: Normal heart sounds.  Pulmonary:     Effort: Pulmonary effort is normal.     Breath sounds: Normal breath sounds.  Abdominal:     Palpations: Abdomen is soft.     Tenderness: There is no abdominal tenderness. There is no right CVA tenderness or left CVA tenderness.  Skin:    Capillary Refill: Capillary refill takes less than 2 seconds.  Neurological:     Mental Status: She is alert and oriented to person, place, and time.  Psychiatric:        Mood and Affect: Mood normal.        Behavior: Behavior normal.        Thought Content: Thought content normal.        Judgment: Judgment normal.    MAU Course  Procedures  --S/p evaluation in MAU 01/10/2021. CBC and UA collected during that encounter. Not reaccomplished in current encounter  --Normal Sinus Rhythm on EKG performed in MAU --Patient repositioning from high fowler's, semi fowler's, supine without evidence of activity intolerance. Denies chest pain, palpitations. No dyspnea, no impact on performance of ADLS.  Orders Placed This Encounter  Procedures  . Basic metabolic panel  . ED EKG   Meds ordered this encounter  Medications  . AND Linked Order Group   . alum & mag hydroxide-simeth (MAALOX/MYLANTA) 200-200-20 MG/5ML suspension 30 mL   . lidocaine (XYLOCAINE) 2 % viscous mouth solution 15 mL   Report given to M. Jimmye Norman, CNM who assumes care of patient at this time.  Mallie Snooks, MSN, CNM Certified Nurse Midwife, [facility] for [employer], [facility] 01/13/21 11:37 PM  Results for orders placed or performed during the hospital encounter of 01/13/21 (from the past 24 hour(s))  Basic metabolic panel     Status: Abnormal   Collection Time: 01/13/21 11:05 PM  Result Value Ref Range   Sodium 135 135 - 145 mmol/L   Potassium 3.3 (L) 3.5 - 5.1 mmol/L   Chloride 102  98 - 111 mmol/L   CO2 20 (L) 22 - 32 mmol/L   Glucose, Bld 89 70 - 99 mg/dL   BUN 5 (L) 6 - 20 mg/dL   Creatinine, Ser 0.45 0.44 - 1.00 mg/dL   Calcium 9.7 8.9 - 10.3 mg/dL   GFR, Estimated >60 >60 mL/min  Anion gap 13 5 - 15   Discussed with Dr Ilda Basset Given recent history of diarrhea, mild hypokalemia is likely related and can be corrected with diet Discussed warning signs to return for Advised rest tonight and adequate hydration Reports history of GERD and has new Rx for Pepcid.   Assessment and Plan  A:  SIngle IUP at [redacted]w[redacted]d       Reported irregular heartbeat       Mild hypokalemia, likely related to recent diarrhea  P:  Discharge home       High potassium foods       PO hydration       Has rx for Pepcid       followup in office as scheduled Encouraged to return if she develops worsening of symptoms, increase in pain, fever, or other concerning symptoms.   Seabron Spates, CNM

## 2021-01-13 NOTE — MAU Note (Signed)
When I was at home cleaning kitchen I felt like my heart rate was irreg. I took my b/p and it was 141/94 and pulse 117. This was about 2130. Sat down and drank water and heart rate just felt weird and irreg. Took b/p couple more times and would not read it. Decided to come to hosp as I was concerned. Denies VB or LOF. Good FM. No pain

## 2021-01-14 NOTE — Discharge Instructions (Signed)
Heartburn During Pregnancy  Heartburn is a type of pain or discomfort in the throat or chest. It may cause a burning feeling. It happens when stomach acid backs up into the part of the body that moves food from your mouth to your stomach (esophagus). This condition is also called acid reflux. Heartburn is common during pregnancy. It usually goes away or gets better after giving birth. What are the causes? This condition is caused by stomach acid that backs up into the part of the body that moves food from your mouth to your stomach. Acid can back up because of: Changing amounts of hormones in the body. Large meals. Certain foods and drinks. Exercise. More acid being made in the stomach. What increases the risk? You are more likely to develop this condition if: You had heartburn before you became pregnant. You have been pregnant more than once before. You are overweight or obese. Heartburn is also likely to happen as you get further along in your pregnancy. The risk is higher in the last 3 months before birth (third trimester). What are the signs or symptoms? Symptoms of this condition include: Burning pain in the chest or lower throat. A bitter taste in the mouth. Coughing. Problems swallowing. Vomiting. A hoarse voice. Asthma. Symptoms may get worse when you lie down or bend over. You may feel worse at night. How is this treated? Treatment for this condition depends on how bad your symptoms are. Your doctor may ask you to: Take over-the-counter medicines for mild heartburn. These medicines include antacids or acid reducers. Take prescription medicines to reduce stomach acid or to protect your stomach. Change your diet. Raise the head of your bed so it is higher than the foot of the bed. Follow these instructions at home: Eating and drinking Do not drink alcohol while you are pregnant. Learn which foods and drinks make you feel worse, and avoid them. Eat small meals often, instead  of large meals. Avoid drinking a lot of liquid with your meals. Avoid eating meals during the 2-3 hours before you go to bed. Avoid lying down right after you eat. Do not exercise right after you eat. Drinks to avoid Coffee and tea (with or without caffeine). Energy drinks and sports drinks. Carbonated drinks or sodas. Citrus fruit juices. Foods to avoid Chocolate and cocoa. Peppermint and mint flavorings. Garlic, onions, and horseradish. Spicy foods and foods that have a lot of acid in them. These include peppers, chili powder, curry powder, vinegar, hot sauces, and barbecue sauce. Citrus fruits, such as oranges, lemons, and limes. Tomato-based foods, such as red sauce, chili, and salsa. Fried and fatty foods, such as donuts, french fries, potato chips, and high-fat dressings. High-fat meats, such as hot dogs, precooked or cured meats, sausage, ham, and bacon. High-fat dairy items, such as whole milk, butter, and cheese. Medicines Take over-the-counter and prescription medicines only as told by your doctor. Do not take aspirin or NSAIDs, such as ibuprofen, unless your doctor tells you to do that. Your doctor may tell you to avoid medicines that have sodium bicarbonate in them. General instructions If told, raise the head of your bed about 6 inches (15 cm). You can do this by putting blocks under the legs. Sleeping with more pillows does not help with heartburn. Do not use any products that contain nicotine or tobacco, such as cigarettes, e-cigarettes, and chewing tobacco. If you need help quitting, ask your doctor. Wear loose-fitting clothing. Try to lower your stress, such as with   more pillows does not help with heartburn.  Do not use any products that contain nicotine or tobacco, such as cigarettes, e-cigarettes, and chewing tobacco. If you need help quitting, ask your doctor.  Wear loose-fitting clothing.  Try to lower your stress, such as with yoga or meditation. If you need help, ask your doctor.  Stay at a healthy weight. If you are overweight, work with your doctor to safely manage your weight.  Keep all follow-up visits as told by your doctor. This  is important. Where to find more information  American Pregnancy Association: americanpregnancy.org Contact a doctor if:  You get new symptoms.  Your symptoms do not get better with treatment.  You lose weight and you do not know why.  You have trouble swallowing.  You make loud sounds when you breathe (wheeze).  You have a cough that does not go away.  You have heartburn often for more than 2 weeks.  You feel like you may vomit (nausea), or you vomit, and this does not get better with treatment.  You have pain in your belly (abdomen). Get help right away if:  You have very bad chest pain that spreads to your arm, neck, or jaw.  You feel sweaty, dizzy, or light-headed.  You have trouble breathing.  You have pain when swallowing.  You vomit, and your vomit looks like blood or coffee grounds.  Your poop (stool) is bloody or black. Summary  Heartburn in pregnancy is common, especially during the last 3 months before birth.  This condition is caused by stomach acid backing up into the part of the body that moves food from the mouth to the stomach.  This condition can be treated with medicines, changes to your diet, or raising the head of your bed.  Contact a doctor if your symptoms do not go away or you get new symptoms. This information is not intended to replace advice given to you by your health care provider. Make sure you discuss any questions you have with your health care provider. Document Revised: 08/27/2019 Document Reviewed: 08/27/2019 Elsevier Patient Education  2021 Elsevier Inc. Second Trimester of Pregnancy  The second trimester of pregnancy is from week 13 through week 27. This is months 4 through 6 of pregnancy. The second trimester is often a time when you feel your best. Your body has adjusted to being pregnant, and you begin to feel better physically. During the second trimester:  Morning sickness has lessened or stopped completely.  You may  have more energy.  You may have an increase in appetite. The second trimester is also a time when the unborn baby (fetus) is growing rapidly. At the end of the sixth month, the fetus may be up to 12 inches long and weigh about 1 pounds. You will likely begin to feel the baby move (quickening) between 16 and 20 weeks of pregnancy. Body changes during your second trimester Your body continues to go through many changes during your second trimester. The changes vary and generally return to normal after the baby is born. Physical changes  Your weight will continue to increase. You will notice your lower abdomen bulging out.  You may begin to get stretch marks on your hips, abdomen, and breasts.  Your breasts will continue to grow and to become tender.  Dark spots or blotches (chloasma or mask of pregnancy) may develop on your face.  A dark line from your belly button to the pubic area (linea nigra) may appear.  You  may have changes in your hair. These can include thickening of your hair, rapid growth, and changes in texture. Some people also have hair loss during or after pregnancy, or hair that feels dry or thin. Health changes  You may develop headaches.  You may have heartburn.  You may develop constipation.  You may develop hemorrhoids or swollen, bulging veins (varicose veins).  Your gums may bleed and may be sensitive to brushing and flossing.  You may urinate more often because the fetus is pressing on your bladder.  You may have back pain. This is caused by: ? Weight gain. ? Pregnancy hormones that are relaxing the joints in your pelvis. ? A shift in weight and the muscles that support your balance. Follow these instructions at home: Medicines  Follow your health care provider's instructions regarding medicine use. Specific medicines may be either safe or unsafe to take during pregnancy. Do not take any medicines unless approved by your health care provider.  Take a  prenatal vitamin that contains at least 600 micrograms (mcg) of folic acid. Eating and drinking  Eat a healthy diet that includes fresh fruits and vegetables, whole grains, good sources of protein such as meat, eggs, or tofu, and low-fat dairy products.  Avoid raw meat and unpasteurized juice, milk, and cheese. These carry germs that can harm you and your baby.  You may need to take these actions to prevent or treat constipation: ? Drink enough fluid to keep your urine pale yellow. ? Eat foods that are high in fiber, such as beans, whole grains, and fresh fruits and vegetables. ? Limit foods that are high in fat and processed sugars, such as fried or sweet foods. Activity  Exercise only as directed by your health care provider. Most people can continue their usual exercise routine during pregnancy. Try to exercise for 30 minutes at least 5 days a week. Stop exercising if you develop contractions in your uterus.  Stop exercising if you develop pain or cramping in the lower abdomen or lower back.  Avoid exercising if it is very hot or humid or if you are at a high altitude.  Avoid heavy lifting.  If you choose to, you may have sex unless your health care provider tells you not to. Relieving pain and discomfort  Wear a supportive bra to prevent discomfort from breast tenderness.  Take warm sitz baths to soothe any pain or discomfort caused by hemorrhoids. Use hemorrhoid cream if your health care provider approves.  Rest with your legs raised (elevated) if you have leg cramps or low back pain.  If you develop varicose veins: ? Wear support hose as told by your health care provider. ? Elevate your feet for 15 minutes, 3-4 times a day. ? Limit salt in your diet. Safety  Wear your seat belt at all times when driving or riding in a car.  Talk with your health care provider if someone is verbally or physically abusive to you. Lifestyle  Do not use hot tubs, steam rooms, or  saunas.  Do not douche. Do not use tampons or scented sanitary pads.  Avoid cat litter boxes and soil used by cats. These carry germs that can cause birth defects in the baby and possibly loss of the fetus by miscarriage or stillbirth.  Do not use herbal remedies, alcohol, illegal drugs, or medicines that are not approved by your health care provider. Chemicals in these products can harm your baby.  Do not use any products that contain  nicotine or tobacco, such as cigarettes, e-cigarettes, and chewing tobacco. If you need help quitting, ask your health care provider. General instructions  During a routine prenatal visit, your health care provider will do a physical exam and other tests. He or she will also discuss your overall health. Keep all follow-up visits. This is important.  Ask your health care provider for a referral to a local prenatal education class.  Ask for help if you have counseling or nutritional needs during pregnancy. Your health care provider can offer advice or refer you to specialists for help with various needs. Where to find more information  American Pregnancy Association: americanpregnancy.org  [employer] of Obstetricians and Gynecologists: https://www.todd-brady.net/  Office on [employer] [facility]: MightyReward.co.nz Contact a health care provider if you have:  A headache that does not go away when you take medicine.  Vision changes or you see spots in front of your eyes.  Mild pelvic cramps, pelvic pressure, or nagging pain in the abdominal area.  Persistent nausea, vomiting, or diarrhea.  A bad-smelling vaginal discharge or foul-smelling urine.  Pain when you urinate.  Sudden or extreme swelling of your face, hands, ankles, feet, or legs.  A fever. Get help right away if you:  Have fluid leaking from your vagina.  Have spotting or bleeding from your vagina.  Have severe abdominal cramping or pain.  Have difficulty  breathing.  Have chest pain.  Have fainting spells.  Have not felt your baby move for the time period told by your health care provider.  Have new or increased pain, swelling, or redness in an arm or leg. Summary  The second trimester of pregnancy is from week 13 through week 27 (months 4 through 6).  Do not use herbal remedies, alcohol, illegal drugs, or medicines that are not approved by your health care provider. Chemicals in these products can harm your baby.  Exercise only as directed by your health care provider. Most people can continue their usual exercise routine during pregnancy.  Keep all follow-up visits. This is important. This information is not intended to replace advice given to you by your health care provider. Make sure you discuss any questions you have with your health care provider. Document Revised: 05/12/2020 Document Reviewed: 03/18/2020 Elsevier Patient Education  2021 [employer].

## 2021-01-28 ENCOUNTER — Telehealth (INDEPENDENT_AMBULATORY_CARE_PROVIDER_SITE_OTHER): Payer: Medicaid Other | Admitting: Obstetrics and Gynecology

## 2021-01-28 DIAGNOSIS — O99342 Other mental disorders complicating pregnancy, second trimester: Secondary | ICD-10-CM | POA: Diagnosis not present

## 2021-01-28 DIAGNOSIS — O99332 Smoking (tobacco) complicating pregnancy, second trimester: Secondary | ICD-10-CM

## 2021-01-28 DIAGNOSIS — R12 Heartburn: Secondary | ICD-10-CM

## 2021-01-28 DIAGNOSIS — Z3480 Encounter for supervision of other normal pregnancy, unspecified trimester: Secondary | ICD-10-CM

## 2021-01-28 DIAGNOSIS — O98511 Other viral diseases complicating pregnancy, first trimester: Secondary | ICD-10-CM

## 2021-01-28 DIAGNOSIS — Z3A26 26 weeks gestation of pregnancy: Secondary | ICD-10-CM

## 2021-01-28 DIAGNOSIS — F329 Major depressive disorder, single episode, unspecified: Secondary | ICD-10-CM

## 2021-01-28 DIAGNOSIS — O98512 Other viral diseases complicating pregnancy, second trimester: Secondary | ICD-10-CM | POA: Diagnosis not present

## 2021-01-28 DIAGNOSIS — U071 COVID-19: Secondary | ICD-10-CM | POA: Diagnosis not present

## 2021-01-28 DIAGNOSIS — F17210 Nicotine dependence, cigarettes, uncomplicated: Secondary | ICD-10-CM

## 2021-01-28 MED ORDER — PANTOPRAZOLE SODIUM 40 MG PO TBEC: 40.0000 mg | DELAYED_RELEASE_TABLET | Freq: Every day | ORAL | 1 refills | Status: DC

## 2021-01-28 MED ORDER — HYDROXYZINE PAMOATE 25 MG PO CAPS: 25.0000 mg | ORAL_CAPSULE | Freq: Three times a day (TID) | ORAL | 2 refills | Status: DC | PRN

## 2021-01-28 NOTE — Progress Notes (Signed)
+   Fetal movement. Pt states she needs RF of hydroxyzine. Pt is still having issues with GERD even with pepcid.

## 2021-01-28 NOTE — Progress Notes (Signed)
    TELEHEALTH OBSTETRICS VISIT ENCOUNTER NOTE  Provider location: Center for [facility] [place]   I connected with Susan Clements on 01/28/21 at  9:15 AM EST by telephone at home and verified that I am speaking with the correct person using two identifiers. Of note, unable to do video encounter due to technical difficulties.    I discussed the limitations, risks, security and privacy concerns of performing an evaluation and management service by telephone and the availability of in person appointments. I also discussed with the patient that there may be a patient responsible charge related to this service. The patient expressed understanding and agreed to proceed.  Subjective:  Susan Clements is a 30 y.o. [id number] at [redacted]w[redacted]d being followed for ongoing prenatal care.  She is currently monitored for the following issues for this low-risk pregnancy and has Supervision of other normal pregnancy, antepartum; Tobacco use during pregnancy, antepartum; Obesity during pregnancy; COVID-19 affecting pregnancy in first trimester; Major depressive disorder, single episode, severe (HCC); Heartburn during pregnancy, antepartum; and Anxiety on their problem list.  Patient reports heartburn. Reports fetal movement. Denies any contractions, bleeding or leaking of fluid. Reports taking pepcid BID and is not working.   The following portions of the patient's history were reviewed and updated as appropriate: allergies, current medications, past family history, past medical history, past social history, past surgical history and problem list.   Patient is at home, provider is in the office.   Objective:  Blood pressure 117/78, pulse 92, last menstrual period 07/27/2020. General:  Alert, oriented and cooperative.   Mental Status: Normal mood and affect perceived. Normal judgment and thought content.  Rest of physical exam deferred due to type of encounter  Assessment and Plan:  Pregnancy: M1D6222 at  [redacted]w[redacted]d 1. COVID-19 affecting pregnancy in first trimester  Doing well   2. Supervision of other normal pregnancy, antepartum  BP looks good.  Not on BASA, allergic to NSAIDS; anaphylaxis  TDAP next visit. 2 hour GTT  She quit smoking.   Preterm labor symptoms and general obstetric precautions including but not limited to vaginal bleeding, contractions, leaking of fluid and fetal movement were reviewed in detail with the patient.  I discussed the assessment and treatment plan with the patient. The patient was provided an opportunity to ask questions and all were answered. The patient agreed with the plan and demonstrated an understanding of the instructions. The patient was advised to call back or seek an in-person office evaluation/go to MAU at [facility] for any urgent or concerning symptoms. Please refer to After Visit Summary for other counseling recommendations.   I provided 10 minutes of non-face-to-face time during this encounter.  Return in about 3 weeks (around 02/18/2021), or For 2 hour GTT, come fasting. app schedule ok.  Future Appointments  Date Time Provider Department Center  03/10/2021  9:30 AM [facility] [place] NURSE [facility]  03/10/2021  9:45 AM [facility]    Venia Carbon, NP Center for [employer], [facility]

## 2021-01-28 NOTE — Patient Instructions (Signed)
Td (Tetanus, Diphtheria) Vaccine: What You Need to Know 1. Why get vaccinated? Td vaccine can prevent tetanus and diphtheria. Tetanus enters the body through cuts or wounds. Diphtheria spreads from person to person.  TETANUS (T) causes painful stiffening of the muscles. Tetanus can lead to serious health problems, including being unable to open the mouth, having trouble swallowing and breathing, or death.  DIPHTHERIA (D) can lead to difficulty breathing, heart failure, paralysis, or death. 2. Td vaccine Td is only for children 7 years and older, adolescents, and adults.  Td is usually given as a booster dose every 10 years, or after 5 years in the case of a severe or dirty wound or burn. Another vaccine, called "Tdap," may be used instead of Td. Tdap protects against pertussis, also known as "whooping cough," in addition to tetanus and diphtheria. Td may be given at the same time as other vaccines. 3. Talk with your health care provider Tell your vaccination provider if the person getting the vaccine:  Has had an allergic reaction after a previous dose of any vaccine that protects against tetanus or diphtheria, or has any severe, life-threatening allergies  Has ever had Guillain-Barr Syndrome (also called "GBS")  Has had severe pain or swelling after a previous dose of any vaccine that protects against tetanus or diphtheria In some cases, your health care provider may decide to postpone Td vaccination until a future visit. People with minor illnesses, such as a cold, may be vaccinated. People who are moderately or severely ill should usually wait until they recover before getting Td vaccine.  Your health care provider can give you more information. 4. Risks of a vaccine reaction  Pain, redness, or swelling where the shot was given, mild fever, headache, feeling tired, and nausea, vomiting, diarrhea, or stomachache sometimes happen after Td vaccination. People sometimes faint after medical  procedures, including vaccination. Tell your provider if you feel dizzy or have vision changes or ringing in the ears.  As with any medicine, there is a very remote chance of a vaccine causing a severe allergic reaction, other serious injury, or death. 5. What if there is a serious problem? An allergic reaction could occur after the vaccinated person leaves the clinic. If you see signs of a severe allergic reaction (hives, swelling of the face and throat, difficulty breathing, a fast heartbeat, dizziness, or weakness), call 9-1-1 and get the person to the nearest hospital.  For other signs that concern you, call your health care provider.  Adverse reactions should be reported to the Vaccine Adverse Event Reporting System (VAERS). Your health care provider will usually file this report, or you can do it yourself. Visit the VAERS website at www.vaers.hhs.gov or call 1-800-822-7967. VAERS is only for reporting reactions, and VAERS staff members do not give medical advice. 6. The National Vaccine Injury Compensation Program The National Vaccine Injury Compensation Program (VICP) is a federal program that was created to compensate people who may have been injured by certain vaccines. Claims regarding alleged injury or death due to vaccination have a time limit for filing, which may be as short as two years. Visit the VICP website at www.hrsa.gov/vaccinecompensation or call 1-800-338-2382 to learn about the program and about filing a claim. 7. How can I learn more?  Ask your health care provider.  Call your local or state health department.  Visit the website of the Food and Drug Administration (FDA) for vaccine package inserts and additional information at www.fda.gov/vaccines-blood-biologics/vaccines.  Contact the Centers for   Disease Control and Prevention (CDC): ? Call 1-800-232-4636 (1-800-CDC-INFO) or ? Visit CDC's website at www.cdc.gov/vaccines. Vaccine Information Statement Td (Tetanus,  Diphtheria) Vaccine (07/23/2020) This information is not intended to replace advice given to you by your health care provider. Make sure you discuss any questions you have with your health care provider. Document Revised: 09/09/2020 Document Reviewed: 09/09/2020 Elsevier Patient Education  2021 Elsevier Inc.  

## 2021-02-18 ENCOUNTER — Other Ambulatory Visit: Payer: Self-pay

## 2021-02-18 ENCOUNTER — Other Ambulatory Visit: Payer: Medicaid Other

## 2021-02-18 ENCOUNTER — Ambulatory Visit (INDEPENDENT_AMBULATORY_CARE_PROVIDER_SITE_OTHER): Payer: 59 | Admitting: Nurse Practitioner

## 2021-02-18 VITALS — BP 102/67 | HR 86 | Wt 262.2 lb

## 2021-02-18 DIAGNOSIS — Z3480 Encounter for supervision of other normal pregnancy, unspecified trimester: Secondary | ICD-10-CM | POA: Diagnosis not present

## 2021-02-18 DIAGNOSIS — U071 COVID-19: Secondary | ICD-10-CM

## 2021-02-18 DIAGNOSIS — Z3A29 29 weeks gestation of pregnancy: Secondary | ICD-10-CM | POA: Diagnosis not present

## 2021-02-18 DIAGNOSIS — F322 Major depressive disorder, single episode, severe without psychotic features: Secondary | ICD-10-CM

## 2021-02-18 DIAGNOSIS — O98511 Other viral diseases complicating pregnancy, first trimester: Secondary | ICD-10-CM

## 2021-02-18 DIAGNOSIS — F419 Anxiety disorder, unspecified: Secondary | ICD-10-CM

## 2021-02-18 DIAGNOSIS — Z23 Encounter for immunization: Secondary | ICD-10-CM

## 2021-02-18 NOTE — Progress Notes (Signed)
    Subjective:  Susan Clements is a 30 y.o. V7C5885 at [redacted]w[redacted]d being seen today for ongoing prenatal care.  She is currently monitored for the following issues for this low-risk pregnancy and has Supervision of other normal pregnancy, antepartum; Obesity during pregnancy; COVID-19 affecting pregnancy in first trimester; Major depressive disorder, single episode, severe (HCC); Heartburn during pregnancy, antepartum; and Anxiety on their problem list.  Patient reports no complaints.  Contractions: Not present. Vag. Bleeding: None.  Movement: Present. Denies leaking of fluid.   The following portions of the patient's history were reviewed and updated as appropriate: allergies, current medications, past family history, past medical history, past social history, past surgical history and problem list. Problem list updated.  Objective:   Vitals:   02/18/21 0833  BP: 102/67  Pulse: 86  Weight: 262 lb 3.2 oz (118.9 kg)    Fetal Status: Fetal Heart Rate (bpm): 140 Fundal Height: 30 cm Movement: Present     General:  Alert, oriented and cooperative. Patient is in no acute distress.  Skin: Skin is warm and dry. No rash noted.   Cardiovascular: Normal heart rate noted  Respiratory: Normal respiratory effort, no problems with respiration noted  Abdomen: Soft, gravid, appropriate for gestational age. Pain/Pressure: Present     Pelvic:  Cervical exam deferred        Extremities: Normal range of motion.  Edema: None  Mental Status: Normal mood and affect. Normal behavior. Normal judgment and thought content.   Urinalysis:      Assessment and Plan:  Pregnancy: O2D7412 at [redacted]w[redacted]d  1. Supervision of other normal pregnancy, antepartum Plans to use pills for contraception Discussed Kegel exercises for increased bladder control Continues to be a nonsmoker!  Reports she will continue to be a nonsmoker for the rest of her life.  - Glucose Tolerance, 2 Hours w/1 Hour - CBC - HIV Antibody (routine  testing w rflx) - RPR - Tdap vaccine greater than or equal to 7yo IM  2. [redacted] weeks gestation of pregnancy  - Glucose Tolerance, 2 Hours w/1 Hour - CBC - HIV Antibody (routine testing w rflx) - RPR - Tdap vaccine greater than or equal to 7yo IM  3. Major depressive disorder, single episode, severe (HCC) Has been in counseling previously but not now.  Reports no active depression at this time Will schedule with Sue Lush as based on this history, she will be at higher risk of PPD  4. Anxiety Takes Vistaril a few times a month to help her deal with anxiety  5.  Covid 19 in first trimester No lingering symptoms now Plans to have whole family vaccinated soon Given info in AVS to find vaccine site  Preterm labor symptoms and general obstetric precautions including but not limited to vaginal bleeding, contractions, leaking of fluid and fetal movement were reviewed in detail with the patient. Please refer to After Visit Summary for other counseling recommendations.  Return in about 2 weeks (around 03/04/2021) for in person ROB.  Nolene Bernheim, RN, MSN, NP-BC Nurse Practitioner, [facility] [employer], [facility] 02/18/2021 9:15 AM

## 2021-02-18 NOTE — Progress Notes (Signed)
Patient presents for ROB. Patient has no concerns today. Tdap vaccine given today.

## 2021-02-18 NOTE — Patient Instructions (Addendum)
[place] Covid Vaccine sites - check at  [email or web address]   Kegel Exercises  Kegel exercises can help strengthen your pelvic floor muscles. The pelvic floor is a group of muscles that support your rectum, small intestine, and bladder. In females, pelvic floor muscles also help support the womb (uterus). These muscles help you control the flow of urine and stool. Kegel exercises are painless and simple, and they do not require any equipment. Your provider may suggest Kegel exercises to:  Improve bladder and bowel control.  Improve sexual response.  Improve weak pelvic floor muscles after surgery to remove the uterus (hysterectomy) or pregnancy (females).  Improve weak pelvic floor muscles after prostate gland removal or surgery (males). Kegel exercises involve squeezing your pelvic floor muscles, which are the same muscles you squeeze when you try to stop the flow of urine or keep from passing gas. The exercises can be done while sitting, standing, or lying down, but it is best to vary your position. Exercises How to do Kegel exercises: 1. Squeeze your pelvic floor muscles tight. You should feel a tight lift in your rectal area. If you are a female, you should also feel a tightness in your vaginal area. Keep your stomach, buttocks, and legs relaxed. 2. Hold the muscles tight for up to 10 seconds. 3. Breathe normally. 4. Relax your muscles. 5. Repeat as told by your health care provider. Repeat this exercise daily as told by your health care provider. Continue to do this exercise for at least 4-6 weeks, or for as long as told by your health care provider. You may be referred to a physical therapist who can help you learn more about how to do Kegel exercises. Depending on your condition, your health care provider may recommend:  Varying how long you squeeze your muscles.  Doing several sets of exercises every day.  Doing  exercises for several weeks.  Making Kegel exercises a part of your regular exercise routine. This information is not intended to replace advice given to you by your health care provider. Make sure you discuss any questions you have with your health care provider. Document Revised: 04/09/2020 Document Reviewed: 07/24/2018 Elsevier Patient Education  2021 [employer].

## 2021-02-19 LAB — CBC
Hematocrit: 30.6 % — ABNORMAL LOW (ref 34.0–46.6)
Hemoglobin: 10.9 g/dL — ABNORMAL LOW (ref 11.1–15.9)
MCH: 32.2 pg (ref 26.6–33.0)
MCHC: 35.6 g/dL (ref 31.5–35.7)
MCV: 90 fL (ref 79–97)
Platelets: 246 10*3/uL (ref 150–450)
RBC: 3.39 x10E6/uL — ABNORMAL LOW (ref 3.77–5.28)
RDW: 11.1 % — ABNORMAL LOW (ref 11.7–15.4)
WBC: 5.3 10*3/uL (ref 3.4–10.8)

## 2021-02-19 LAB — HIV ANTIBODY (ROUTINE TESTING W REFLEX): HIV Screen 4th Generation wRfx: NONREACTIVE

## 2021-02-19 LAB — GLUCOSE TOLERANCE, 2 HOURS W/ 1HR
Glucose, 1 hour: 204 mg/dL — ABNORMAL HIGH (ref 65–179)
Glucose, 2 hour: 139 mg/dL (ref 65–152)
Glucose, Fasting: 103 mg/dL — ABNORMAL HIGH (ref 65–91)

## 2021-02-19 LAB — RPR: RPR Ser Ql: NONREACTIVE

## 2021-02-20 ENCOUNTER — Encounter: Payer: Self-pay | Admitting: Nurse Practitioner

## 2021-02-20 DIAGNOSIS — O24419 Gestational diabetes mellitus in pregnancy, unspecified control: Secondary | ICD-10-CM | POA: Insufficient documentation

## 2021-02-21 ENCOUNTER — Other Ambulatory Visit: Payer: Self-pay

## 2021-02-21 DIAGNOSIS — O24410 Gestational diabetes mellitus in pregnancy, diet controlled: Secondary | ICD-10-CM

## 2021-02-21 MED ORDER — ACCU-CHEK SOFTCLIX LANCETS MISC
12 refills | Status: DC
Start: 2021-02-21 — End: 2021-05-03

## 2021-02-21 MED ORDER — ACCU-CHEK GUIDE W/DEVICE KIT: 1.0000 | PACK | Freq: Four times a day (QID) | 0 refills | Status: DC

## 2021-02-21 MED ORDER — ACCU-CHEK GUIDE VI STRP: ORAL_STRIP | 12 refills | Status: DC

## 2021-02-24 NOTE — Telephone Encounter (Signed)
I did not respond to pt yet - I don't know if you prefer her to write her numbers on a log sheet even though she has the app. She is not scheduled for GDM education until the 28th. If she can come in Wednesdays for class, she can get the education next week.

## 2021-03-01 ENCOUNTER — Encounter: Payer: Medicaid Other | Admitting: Licensed Clinical Social Worker

## 2021-03-04 ENCOUNTER — Ambulatory Visit (INDEPENDENT_AMBULATORY_CARE_PROVIDER_SITE_OTHER): Payer: Medicaid Other | Admitting: Obstetrics & Gynecology

## 2021-03-04 ENCOUNTER — Other Ambulatory Visit: Payer: Self-pay

## 2021-03-04 ENCOUNTER — Encounter: Payer: Self-pay | Admitting: Obstetrics & Gynecology

## 2021-03-04 VITALS — BP 103/73 | HR 96 | Wt 262.0 lb

## 2021-03-04 DIAGNOSIS — O99210 Obesity complicating pregnancy, unspecified trimester: Secondary | ICD-10-CM

## 2021-03-04 DIAGNOSIS — O24410 Gestational diabetes mellitus in pregnancy, diet controlled: Secondary | ICD-10-CM

## 2021-03-04 DIAGNOSIS — Z3480 Encounter for supervision of other normal pregnancy, unspecified trimester: Secondary | ICD-10-CM

## 2021-03-04 NOTE — Patient Instructions (Signed)

## 2021-03-04 NOTE — Progress Notes (Signed)
ROB c/o frequent migraines 7/10 everyday. She is not on any medication.

## 2021-03-04 NOTE — Progress Notes (Signed)
   PRENATAL VISIT NOTE  Subjective:  Susan Clements is a 30 y.o. H2D9242 at [redacted]w[redacted]d being seen today for ongoing prenatal care.  She is currently monitored for the following issues for this high-risk pregnancy and has Supervision of other normal pregnancy, antepartum; Obesity during pregnancy; COVID-19 affecting pregnancy in first trimester; Major depressive disorder, single episode, severe (HCC); Heartburn during pregnancy, antepartum; Anxiety; and Gestational diabetes on their problem list.  Patient reports headache.  Contractions: Not present. Vag. Bleeding: None.  Movement: Present. Denies leaking of fluid.   The following portions of the patient's history were reviewed and updated as appropriate: allergies, current medications, past family history, past medical history, past social history, past surgical history and problem list.   Objective:   Vitals:   03/04/21 0822  BP: 103/73  Pulse: 96  Weight: 262 lb (118.8 kg)    Fetal Status: Fetal Heart Rate (bpm): 151   Movement: Present     General:  Alert, oriented and cooperative. Patient is in no acute distress.  Skin: Skin is warm and dry. No rash noted.   Cardiovascular: Normal heart rate noted  Respiratory: Normal respiratory effort, no problems with respiration noted  Abdomen: Soft, gravid, appropriate for gestational age.  Pain/Pressure: Present     Pelvic: Cervical exam deferred        Extremities: Normal range of motion.  Edema: Trace  Mental Status: Normal mood and affect. Normal behavior. Normal judgment and thought content.   Assessment and Plan:  Pregnancy: A8T4196 at [redacted]w[redacted]d 1. Diet controlled gestational diabetes mellitus (GDM) in third trimester FBS 60% <95, PP a few above 120, she needs to have dietary counseling  2. Obesity during pregnancy Body mass index is 44.97 kg/m.   3. Supervision of other normal pregnancy, antepartum F/u US next week  Preterm labor symptoms and general obstetric precautions  including but not limited to vaginal bleeding, contractions, leaking of fluid and fetal movement were reviewed in detail with the patient. Please refer to After Visit Summary for other counseling recommendations.   Return in about 2 weeks (around 03/18/2021).  Future Appointments  Date Time Provider Department Center  03/14/2021  8:30 AM Carolan Shiver, RD [facility]  03/15/2021  7:15 AM [facility] NURSE [facility]  03/15/2021  7:30 AM [facility] US3 [facility]  03/18/2021  9:00 AM Anyanwu, Jethro Bastos, MD [facility] None    Scheryl Darter, MD

## 2021-03-10 ENCOUNTER — Ambulatory Visit: Payer: Medicaid Other

## 2021-03-14 ENCOUNTER — Other Ambulatory Visit: Payer: Self-pay

## 2021-03-14 ENCOUNTER — Encounter: Payer: Self-pay | Admitting: Registered"

## 2021-03-14 ENCOUNTER — Encounter: Payer: Medicaid Other | Attending: Nurse Practitioner | Admitting: Registered"

## 2021-03-14 DIAGNOSIS — O24419 Gestational diabetes mellitus in pregnancy, unspecified control: Secondary | ICD-10-CM | POA: Insufficient documentation

## 2021-03-14 NOTE — Progress Notes (Signed)
Patient was seen on 03/14/21 for Gestational Diabetes self-management. EDD 05/03/21; [redacted]w[redacted]d . Patient states no history of GDM. Diet history obtained. Patient eats variety of all food groups. Beverages include water, decaf coffee with splenda.    The following learning objectives were met by the patient :   States the definition of Gestational Diabetes  States why dietary management is important in controlling blood glucose  Describes the effects of carbohydrates on blood glucose levels  Demonstrates ability to create a balanced meal plan  Demonstrates carbohydrate counting   States when to check blood glucose levels  Demonstrates proper blood glucose monitoring techniques  States the effect of stress and exercise on blood glucose levels  States the importance of limiting caffeine and abstaining from alcohol and smoking  Plan:  Aim for 3 Carbohydrate Choices per meal (45 grams) +/- 1 either way  Aim for 1-2 Carbohydrate Choices per snack Begin reading food labels for Total Carbohydrate of foods If OK with your MD, consider  increasing your activity level by walking, Arm Chair Exercises or other activity daily as tolerated Begin checking Blood Glucose before breakfast and 2 hours after first bite of breakfast, lunch and dinner as directed by MD  Bring Log Book/Sheet and meter to every medical appointment  Take medication if directed by MD  Patient already has a meter, is testing pre breakfast and 2 hours after each meal.  Pt reports FBS mostly WNL, a few 98-101 mg/dL Tries to stay below 100 after meals, only 1 above 124 mg/dL.   Patient instructed to monitor glucose levels: FBS: 60 - 95 mg/dl 2 hour: <120 mg/dl  Patient received the following handouts:  Nutrition Diabetes and Pregnancy  Carbohydrate Counting List  Blood glucose Log Sheet  Patient will be seen for follow-up in 2 weeks or as needed.

## 2021-03-15 ENCOUNTER — Ambulatory Visit: Payer: Medicaid Other | Attending: Maternal & Fetal Medicine | Admitting: *Deleted

## 2021-03-15 ENCOUNTER — Other Ambulatory Visit: Payer: Self-pay

## 2021-03-15 ENCOUNTER — Encounter: Payer: Self-pay | Admitting: *Deleted

## 2021-03-15 ENCOUNTER — Other Ambulatory Visit: Payer: Self-pay | Admitting: *Deleted

## 2021-03-15 ENCOUNTER — Ambulatory Visit (HOSPITAL_BASED_OUTPATIENT_CLINIC_OR_DEPARTMENT_OTHER): Payer: Medicaid Other

## 2021-03-15 DIAGNOSIS — F17210 Nicotine dependence, cigarettes, uncomplicated: Secondary | ICD-10-CM

## 2021-03-15 DIAGNOSIS — O99213 Obesity complicating pregnancy, third trimester: Secondary | ICD-10-CM

## 2021-03-15 DIAGNOSIS — O24410 Gestational diabetes mellitus in pregnancy, diet controlled: Secondary | ICD-10-CM | POA: Diagnosis not present

## 2021-03-15 DIAGNOSIS — E669 Obesity, unspecified: Secondary | ICD-10-CM

## 2021-03-15 DIAGNOSIS — Z362 Encounter for other antenatal screening follow-up: Secondary | ICD-10-CM | POA: Insufficient documentation

## 2021-03-15 DIAGNOSIS — Z3A33 33 weeks gestation of pregnancy: Secondary | ICD-10-CM

## 2021-03-15 DIAGNOSIS — O98511 Other viral diseases complicating pregnancy, first trimester: Secondary | ICD-10-CM

## 2021-03-15 DIAGNOSIS — O24419 Gestational diabetes mellitus in pregnancy, unspecified control: Secondary | ICD-10-CM | POA: Insufficient documentation

## 2021-03-15 DIAGNOSIS — O99333 Smoking (tobacco) complicating pregnancy, third trimester: Secondary | ICD-10-CM | POA: Diagnosis not present

## 2021-03-15 DIAGNOSIS — Z6837 Body mass index (BMI) 37.0-37.9, adult: Secondary | ICD-10-CM

## 2021-03-15 DIAGNOSIS — Z3480 Encounter for supervision of other normal pregnancy, unspecified trimester: Secondary | ICD-10-CM

## 2021-03-18 ENCOUNTER — Encounter: Payer: Medicaid Other | Admitting: Obstetrics & Gynecology

## 2021-03-29 ENCOUNTER — Encounter: Payer: Self-pay | Admitting: Obstetrics and Gynecology

## 2021-03-29 ENCOUNTER — Other Ambulatory Visit: Payer: Self-pay

## 2021-03-29 ENCOUNTER — Ambulatory Visit (INDEPENDENT_AMBULATORY_CARE_PROVIDER_SITE_OTHER): Payer: Medicaid Other | Admitting: Obstetrics and Gynecology

## 2021-03-29 VITALS — BP 105/69 | HR 80 | Wt 263.0 lb

## 2021-03-29 DIAGNOSIS — O24410 Gestational diabetes mellitus in pregnancy, diet controlled: Secondary | ICD-10-CM

## 2021-03-29 DIAGNOSIS — Z3483 Encounter for supervision of other normal pregnancy, third trimester: Secondary | ICD-10-CM

## 2021-03-29 DIAGNOSIS — Z3480 Encounter for supervision of other normal pregnancy, unspecified trimester: Secondary | ICD-10-CM

## 2021-03-29 NOTE — Progress Notes (Signed)
Subjective:  Susan Clements is a 30 y.o. A1O8786 at [redacted]w[redacted]d being seen today for ongoing prenatal care.  She is currently monitored for the following issues for this high-risk pregnancy and has Supervision of other normal pregnancy, antepartum; Obesity during pregnancy; Major depressive disorder, single episode, severe (HCC); Heartburn during pregnancy, antepartum; Anxiety; and Gestational diabetes on their problem list.  Patient reports general discomforts of pregnancy.  Contractions: Not present. Vag. Bleeding: None.  Movement: Present. Denies leaking of fluid.   The following portions of the patient's history were reviewed and updated as appropriate: allergies, current medications, past family history, past medical history, past social history, past surgical history and problem list. Problem list updated.  Objective:   Vitals:   03/29/21 0847  BP: 105/69  Pulse: 80  Weight: 263 lb (119.3 kg)    Fetal Status: Fetal Heart Rate (bpm): 145   Movement: Present     General:  Alert, oriented and cooperative. Patient is in no acute distress.  Skin: Skin is warm and dry. No rash noted.   Cardiovascular: Normal heart rate noted  Respiratory: Normal respiratory effort, no problems with respiration noted  Abdomen: Soft, gravid, appropriate for gestational age. Pain/Pressure: Present     Pelvic:  Cervical exam deferred        Extremities: Normal range of motion.  Edema: Trace  Mental Status: Normal mood and affect. Normal behavior. Normal judgment and thought content.   Urinalysis:      Assessment and Plan:  Pregnancy: V6H2094 at [redacted]w[redacted]d  1. Supervision of other normal pregnancy, antepartum Stable GBS next visit  2. Diet controlled gestational diabetes mellitus (GDM) in third trimester CBG's in goal range for most part. Outliers related to diet choices. Diet reinforced, continue with diet Growth scan 04/12/21  Preterm labor symptoms and general obstetric precautions including but not  limited to vaginal bleeding, contractions, leaking of fluid and fetal movement were reviewed in detail with the patient. Please refer to After Visit Summary for other counseling recommendations.  Return in about 1 week (around 04/05/2021) for OB visit, face to face, any provider.   Hermina Staggers, MD

## 2021-03-29 NOTE — Patient Instructions (Signed)

## 2021-03-29 NOTE — Progress Notes (Signed)
+   Fetal movement. No complaints.  

## 2021-04-05 ENCOUNTER — Other Ambulatory Visit (HOSPITAL_COMMUNITY)
Admission: RE | Admit: 2021-04-05 | Discharge: 2021-04-05 | Disposition: A | Discharge status: Home / Self Care | Payer: 59 | Source: Ambulatory Visit | Attending: Obstetrics and Gynecology | Admitting: Obstetrics and Gynecology

## 2021-04-05 ENCOUNTER — Other Ambulatory Visit: Payer: Self-pay

## 2021-04-05 ENCOUNTER — Ambulatory Visit (INDEPENDENT_AMBULATORY_CARE_PROVIDER_SITE_OTHER): Payer: Medicaid Other | Admitting: Obstetrics and Gynecology

## 2021-04-05 VITALS — BP 97/63 | HR 93 | Wt 266.0 lb

## 2021-04-05 DIAGNOSIS — O0990 Supervision of high risk pregnancy, unspecified, unspecified trimester: Secondary | ICD-10-CM

## 2021-04-05 DIAGNOSIS — Z6841 Body Mass Index (BMI) 40.0 and over, adult: Secondary | ICD-10-CM | POA: Insufficient documentation

## 2021-04-05 DIAGNOSIS — O99210 Obesity complicating pregnancy, unspecified trimester: Secondary | ICD-10-CM

## 2021-04-05 DIAGNOSIS — O24410 Gestational diabetes mellitus in pregnancy, diet controlled: Secondary | ICD-10-CM

## 2021-04-05 DIAGNOSIS — Z3A36 36 weeks gestation of pregnancy: Secondary | ICD-10-CM

## 2021-04-05 NOTE — Progress Notes (Addendum)
ROB c/o migraine headaches 10/10 x 2 days. Fasting BS 85-105, 2 hrs after meals 86-120

## 2021-04-05 NOTE — Progress Notes (Signed)
   PRENATAL VISIT NOTE  Subjective:  Susan Clements is a 30 y.o. D7A1287 at [redacted]w[redacted]d being seen today for ongoing prenatal care.  She is currently monitored for the following issues for this high-risk pregnancy and has Supervision of high risk pregnancy, antepartum; Obesity during pregnancy; Major depressive disorder, single episode, severe (HCC); Heartburn during pregnancy, antepartum; Anxiety; Gestational diabetes; and BMI 45.0-49.9, adult (HCC) on their problem list.  Patient reports no issues; no HAs endorsed.  Contractions: Irregular. Vag. Bleeding: None.  Movement: Present. Denies leaking of fluid.   The following portions of the patient's history were reviewed and updated as appropriate: allergies, current medications, past family history, past medical history, past social history, past surgical history and problem list.   Objective:   Vitals:   04/05/21 0819  BP: 97/63  Pulse: 93  Weight: 266 lb (120.7 kg)    Fetal Status: Fetal Heart Rate (bpm): 139 Fundal Height: 35 cm Movement: Present  Presentation: Vertex  General:  Alert, oriented and cooperative. Patient is in no acute distress.  Skin: Skin is warm and dry. No rash noted.   Cardiovascular: Normal heart rate noted  Respiratory: Normal respiratory effort, no problems with respiration noted  Abdomen: Soft, gravid, appropriate for gestational age.  Pain/Pressure: Present     Pelvic: Cervical exam performed in the presence of a chaperone Dilation: Fingertip Effacement (%): 0 Station: Ballotable  Extremities: Normal range of motion.  Edema: Trace  Mental Status: Normal mood and affect. Normal behavior. Normal judgment and thought content.   Assessment and Plan:  Pregnancy: O6V6720 at [redacted]w[redacted]d 1. [redacted] weeks gestation of pregnancy Routine care. D/w pt more re: BC nv  2. BMI 45.0-49.9, adult (HCC)  3. Supervision of high risk pregnancy, antepartum - Strep Gp B NAA - Cervicovaginal ancillary only( [place])  4.  GDMa1 Normal CBG log. Has rpt growth in one week. Set up IOL nv.    Preterm labor symptoms and general obstetric precautions including but not limited to vaginal bleeding, contractions, leaking of fluid and fetal movement were reviewed in detail with the patient. Please refer to After Visit Summary for other counseling recommendations.   Return in about 1 week (around 04/12/2021) for in person, md or app, low risk.  Future Appointments  Date Time Provider Department Center  04/12/2021  7:45 AM [facility] NURSE [facility]  04/12/2021  8:00 AM [facility] US1 [facility]    Friend Bing, MD

## 2021-04-06 LAB — CERVICOVAGINAL ANCILLARY ONLY
Chlamydia: NEGATIVE
Comment: NEGATIVE
Comment: NORMAL
Neisseria Gonorrhea: NEGATIVE

## 2021-04-07 LAB — STREP GP B NAA: Strep Gp B NAA: NEGATIVE

## 2021-04-11 ENCOUNTER — Other Ambulatory Visit: Payer: Self-pay

## 2021-04-11 DIAGNOSIS — Z3491 Encounter for supervision of normal pregnancy, unspecified, first trimester: Secondary | ICD-10-CM

## 2021-04-11 MED ORDER — BLOOD PRESSURE KIT DEVI: 1.0000 | 0 refills | Status: DC

## 2021-04-12 ENCOUNTER — Encounter: Payer: Self-pay | Admitting: *Deleted

## 2021-04-12 ENCOUNTER — Other Ambulatory Visit: Payer: Self-pay

## 2021-04-12 ENCOUNTER — Ambulatory Visit: Payer: 59 | Admitting: *Deleted

## 2021-04-12 ENCOUNTER — Ambulatory Visit: Payer: 59 | Attending: Obstetrics and Gynecology

## 2021-04-12 DIAGNOSIS — O99213 Obesity complicating pregnancy, third trimester: Secondary | ICD-10-CM | POA: Diagnosis not present

## 2021-04-12 DIAGNOSIS — Z3A37 37 weeks gestation of pregnancy: Secondary | ICD-10-CM

## 2021-04-12 DIAGNOSIS — Z362 Encounter for other antenatal screening follow-up: Secondary | ICD-10-CM

## 2021-04-12 DIAGNOSIS — E669 Obesity, unspecified: Secondary | ICD-10-CM

## 2021-04-12 DIAGNOSIS — O0990 Supervision of high risk pregnancy, unspecified, unspecified trimester: Secondary | ICD-10-CM | POA: Insufficient documentation

## 2021-04-12 DIAGNOSIS — O99333 Smoking (tobacco) complicating pregnancy, third trimester: Secondary | ICD-10-CM

## 2021-04-12 DIAGNOSIS — O24410 Gestational diabetes mellitus in pregnancy, diet controlled: Secondary | ICD-10-CM | POA: Insufficient documentation

## 2021-04-13 ENCOUNTER — Ambulatory Visit (INDEPENDENT_AMBULATORY_CARE_PROVIDER_SITE_OTHER): Payer: Medicaid Other | Admitting: Women's Health

## 2021-04-13 VITALS — BP 109/67 | HR 86 | Wt 270.0 lb

## 2021-04-13 DIAGNOSIS — F322 Major depressive disorder, single episode, severe without psychotic features: Secondary | ICD-10-CM

## 2021-04-13 DIAGNOSIS — O99210 Obesity complicating pregnancy, unspecified trimester: Secondary | ICD-10-CM

## 2021-04-13 DIAGNOSIS — O24410 Gestational diabetes mellitus in pregnancy, diet controlled: Secondary | ICD-10-CM

## 2021-04-13 DIAGNOSIS — Z3A37 37 weeks gestation of pregnancy: Secondary | ICD-10-CM

## 2021-04-13 DIAGNOSIS — R2242 Localized swelling, mass and lump, left lower limb: Secondary | ICD-10-CM

## 2021-04-13 DIAGNOSIS — F419 Anxiety disorder, unspecified: Secondary | ICD-10-CM

## 2021-04-13 DIAGNOSIS — O0990 Supervision of high risk pregnancy, unspecified, unspecified trimester: Secondary | ICD-10-CM

## 2021-04-13 NOTE — Patient Instructions (Signed)
Maternity Assessment Unit (MAU)  The Maternity Assessment Unit (MAU) is located at the [facility] at [facility]. The address is: [address] [place], [place], [place] [address]. Please see map below for additional directions.    The Maternity Assessment Unit is designed to help you during your pregnancy, and for up to 6 weeks after delivery, with any pregnancy- or postpartum-related emergencies, if you think you are in labor, or if your water has broken. For example, if you experience nausea and vomiting, vaginal bleeding, severe abdominal or pelvic pain, elevated blood pressure or other problems related to your pregnancy or postpartum time, please come to the Maternity Assessment Unit for assistance.       New Induction of Labor Process for [employer] [facility]  In Fall 2020 [facility] changed it's process for scheduling inductions of labor to create more induction slots and to make sure patients get COVID-19 testing in advance. After you have been tested you need to quarantine so that you do not get infected after your test. You should not go anywhere after your test except necessary medical appointments.  You have been scheduled for induction of labor between 39-40 weeks. Although you may have a specific time listed on your After Visit Summary or MyChart, we cannot predict when your room will be available. Please disregard this time. A Labor and Delivery staff member will call you on the day that you are scheduled when your room is available. You will need to arrive within one hour of being called. If you do not arrive within this time frame, the next person on the list will be called in and you will move down the list. You may eat a light meal before coming to the hospital. If you go into labor, think your water has broken, experience bright red bleeding or don't feel your baby moving as much as usual  before your induction, please call your Ob/Gyn's office or come to Entrance C, Maternity Assessment Unit for evaluation.  Thank you,  [facility]       Signs and Symptoms of Labor Labor is the body's natural process of moving the baby and the placenta out of the uterus. The process of labor usually starts when the baby is full-term, between 96 and 40 weeks of pregnancy. Signs and symptoms that you are close to going into labor As your body prepares for labor and the birth of your baby, you may notice the following symptoms in the weeks and days before true labor starts:  Passing a small amount of thick, bloody mucus from your vagina. This is called normal bloody show or losing your mucus plug. This may happen more than a week before labor begins, or right before labor begins, as the opening of the cervix starts to widen (dilate). For some women, the entire mucus plug passes at once. For others, pieces of the mucus plug may gradually pass over several days.  Your baby moving (dropping) lower in your pelvis to get into position for birth (lightening). When this happens, you may feel more pressure on your bladder and pelvic bone and less pressure on your ribs. This may make it easier to breathe. It may also cause you to need to urinate more often and have problems with bowel movements.  Having "practice contractions," also called Braxton Hicks contractions or false labor. These occur at irregular (unevenly spaced) intervals that are more than  10 minutes apart. False labor contractions are common after exercise or sexual activity. They will stop if you change position, rest, or drink fluids. These contractions are usually mild and do not get stronger over time. They may feel like: ? A backache or back pain. ? Mild cramps, similar to menstrual cramps. ? Tightening or pressure in your abdomen. Other early symptoms include:  Nausea or loss of appetite.  Diarrhea.  Having a  sudden burst of energy, or feeling very tired.  Mood changes.  Having trouble sleeping.   Signs and symptoms that labor has begun Signs that you are in labor may include:  Having contractions that come at regular (evenly spaced) intervals and increase in intensity. This may feel like more intense tightening or pressure in your abdomen that moves to your back. ? Contractions may also feel like rhythmic pain in your upper thighs or back that comes and goes at regular intervals. ? For first-time mothers, this change in intensity of contractions often occurs at a more gradual pace. ? Women who have given birth before may notice a more rapid progression of contraction changes.  Feeling pressure in the vaginal area.  Your water breaking (rupture of membranes). This is when the sac of fluid that surrounds your baby breaks. Fluid leaking from your vagina may be clear or blood-tinged. Labor usually starts within 24 hours of your water breaking, but it may take longer to begin. ? Some women may feel a sudden gush of fluid. ? Others notice that their underwear repeatedly becomes damp. Follow these instructions at home:  When labor starts, or if your water breaks, call your health care provider or nurse care line. Based on your situation, they will determine when you should go in for an exam.  During early labor, you may be able to rest and manage symptoms at home. Some strategies to try at home include: ? Breathing and relaxation techniques. ? Taking a warm bath or shower. ? Listening to music. ? Using a heating pad on the lower back for pain. If you are directed to use heat:  Place a towel between your skin and the heat source.  Leave the heat on for 20-30 minutes.  Remove the heat if your skin turns bright red. This is especially important if you are unable to feel pain, heat, or cold. You may have a greater risk of getting burned.   Contact a health care provider if:  Your labor has  started.  Your water breaks. Get help right away if:  You have painful, regular contractions that are 5 minutes apart or less.  Labor starts before you are [redacted] weeks along in your pregnancy.  You have a fever.  You have bright red blood coming from your vagina.  You do not feel your baby moving.  You have a severe headache with or without vision problems.  You have severe nausea, vomiting, or diarrhea.  You have chest pain or shortness of breath. These symptoms may represent a serious problem that is an emergency. Do not wait to see if the symptoms will go away. Get medical help right away. Call your local emergency services (911 in the [place] Do not drive yourself to the hospital. Summary  Labor is your body's natural process of moving your baby and the placenta out of your uterus.  The process of labor usually starts when your baby is full-term, between 34 and 40 weeks of pregnancy.  When labor starts, or if your water breaks,  call your health care provider or nurse care line. Based on your situation, they will determine when you should go in for an exam. This information is not intended to replace advice given to you by your health care provider. Make sure you discuss any questions you have with your health care provider. Document Revised: 09/25/2020 Document Reviewed: 09/25/2020 Elsevier Patient Education  2021 [employer].

## 2021-04-13 NOTE — Progress Notes (Signed)
ROB, c/o swollen feet, pain in feet 7/10, painful to touch and walk, headache 10/10, blurry vision x 2 months, pressure.  BS fasting 79-102, after meal 89-95.

## 2021-04-13 NOTE — Progress Notes (Signed)
Subjective:  Susan Clements is a 30 y.o. [id number] at [redacted]w[redacted]d being seen today for ongoing prenatal care.  She is currently monitored for the following issues for this high-risk pregnancy and has Supervision of high risk pregnancy, antepartum; Obesity during pregnancy; Major depressive disorder, single episode, severe (HCC); Heartburn during pregnancy, antepartum; Anxiety; Gestational diabetes; and BMI 45.0-49.9, adult (HCC) on their problem list.  Patient reports no complaints.  Contractions: Irregular. Vag. Bleeding: None.  Movement: Present. Denies leaking of fluid.   The following portions of the patient's history were reviewed and updated as appropriate: allergies, current medications, past family history, past medical history, past social history, past surgical history and problem list. Problem list updated.  Objective:   Vitals:   04/13/21 0912  BP: 109/67  Pulse: 86  Weight: 270 lb (122.5 kg)    Fetal Status: Fetal Heart Rate (bpm): 160   Movement: Present     General:  Alert, oriented and cooperative. Patient is in no acute distress.  Skin: Skin is warm and dry. No rash noted.   Cardiovascular: Normal heart rate noted  Respiratory: Normal respiratory effort, no problems with respiration noted  Abdomen: Soft, gravid, appropriate for gestational age. Pain/Pressure: Present     Pelvic: Vag. Bleeding: None     Cervical exam deferred        Extremities: Normal range of motion.  Edema: Mild pitting, slight indentation  Mental Status: Normal mood and affect. Normal behavior. Normal judgment and thought content.   Urinalysis:      Assessment and Plan:  Pregnancy: [id number] at 104w1d  1. Supervision of high risk pregnancy, antepartum  2. Obesity during pregnancy  3. Major depressive disorder, single episode, severe (HCC) -no concerns  4. Anxiety -pt reports anxiety is increasing, but she is managing at home with support of her husband -pt reports she had a visit with BH earlier,  but does not desire to return -discussed risks ppartum associated with pre-existing anxiety/depression -pt advised to be seen in MAU/ED with SI/HI or to call office with worsening anxiety for visit and possible start of medication -pt reports she has Vistaril at home which she uses sparingly, pt advised if noticing increase in frequency of use to call office  5. Diet controlled gestational diabetes mellitus (GDM) in third trimester -pt did not bring blood sugar logs to visit today, will take photos and send via MyChart message after returning home, per pt all numbers normal -IOL to be scheduled 39-40 weeks, orders entered -EFW 57% on growth scan yesterday  6. [redacted] weeks gestation of pregnancy  7. Localized swelling of left foot - pt reports bilateral foot swelling with worsening swelling of left foot - on exam, feet swollen bilaterally, left foot slightly more swollen than right foot, and small circular area of additional swelling on upper outer aspect of left foot that is painful to palpation. No redness or warmth or bruising associated with area. Patient denies recent trauma. Consulted with Dr. Alysia Penna who states blood clot in area highly unlikely, but recommends X-ray of foot. Patient advised with worsening pain can be seen in ED/Urgent Care for immediate evaluation. - DG Foot Complete Left; Future   Term labor symptoms and general obstetric precautions including but not limited to vaginal bleeding, contractions, leaking of fluid and fetal movement were reviewed in detail with the patient. I discussed the assessment and treatment plan with the patient. The patient was provided an opportunity to ask questions and all were answered. The patient agreed with the  plan and demonstrated an understanding of the instructions. The patient was advised to call back or seek an in-person office evaluation/go to MAU at [facility] for any urgent or concerning symptoms. Please refer to After Visit  Summary for other counseling recommendations.  Return in about 1 week (around 04/20/2021) for in-person HOB/APP OK, needs foot Xray.   Iniya Matzek, Odie Sera, NP

## 2021-04-14 ENCOUNTER — Ambulatory Visit (HOSPITAL_COMMUNITY)
Admission: RE | Admit: 2021-04-14 | Discharge: 2021-04-14 | Disposition: A | Discharge status: Home / Self Care | Payer: 59 | Source: Ambulatory Visit | Attending: Women's Health | Admitting: Women's Health

## 2021-04-14 ENCOUNTER — Other Ambulatory Visit: Payer: Self-pay

## 2021-04-14 DIAGNOSIS — R2242 Localized swelling, mass and lump, left lower limb: Secondary | ICD-10-CM | POA: Diagnosis present

## 2021-04-19 ENCOUNTER — Telehealth (HOSPITAL_COMMUNITY): Payer: Self-pay | Admitting: *Deleted

## 2021-04-19 ENCOUNTER — Encounter (HOSPITAL_COMMUNITY): Payer: Self-pay | Admitting: *Deleted

## 2021-04-19 NOTE — Telephone Encounter (Signed)
Preadmission screen  

## 2021-04-20 ENCOUNTER — Other Ambulatory Visit: Payer: Self-pay | Admitting: Advanced Practice Midwife

## 2021-04-21 ENCOUNTER — Encounter: Payer: Medicaid Other | Admitting: Obstetrics & Gynecology

## 2021-04-26 ENCOUNTER — Encounter: Payer: Self-pay | Admitting: Obstetrics

## 2021-04-26 ENCOUNTER — Ambulatory Visit (INDEPENDENT_AMBULATORY_CARE_PROVIDER_SITE_OTHER): Payer: Medicaid Other | Admitting: Obstetrics

## 2021-04-26 ENCOUNTER — Other Ambulatory Visit: Payer: Self-pay

## 2021-04-26 VITALS — BP 108/70 | HR 59 | Wt 272.0 lb

## 2021-04-26 DIAGNOSIS — O0990 Supervision of high risk pregnancy, unspecified, unspecified trimester: Secondary | ICD-10-CM

## 2021-04-26 DIAGNOSIS — O24410 Gestational diabetes mellitus in pregnancy, diet controlled: Secondary | ICD-10-CM

## 2021-04-26 DIAGNOSIS — O99210 Obesity complicating pregnancy, unspecified trimester: Secondary | ICD-10-CM

## 2021-04-26 DIAGNOSIS — F322 Major depressive disorder, single episode, severe without psychotic features: Secondary | ICD-10-CM

## 2021-04-26 NOTE — Progress Notes (Addendum)
ROB, c/o NV, swollen feet, tingling and burning in feet. BS fasting 79-90 and 2 hours after meals 90-95. Induction is scheduled for 04/30/21

## 2021-04-26 NOTE — Progress Notes (Signed)
Subjective:  Susan Clements is a 30 y.o. [id number] at [redacted]w[redacted]d being seen today for ongoing prenatal care.  She is currently monitored for the following issues for this high-risk pregnancy and has Supervision of high risk pregnancy, antepartum; Obesity during pregnancy; Major depressive disorder, single episode, severe (HCC); Heartburn during pregnancy, antepartum; Anxiety; Gestational diabetes; and BMI 45.0-49.9, adult (HCC) on their problem list.  Patient reports no complaints.  Contractions: Irregular. Vag. Bleeding: None.  Movement: Present. Denies leaking of fluid.   The following portions of the patient's history were reviewed and updated as appropriate: allergies, current medications, past family history, past medical history, past social history, past surgical history and problem list. Problem list updated.  Objective:   Vitals:   04/26/21 0826  BP: 108/70  Pulse: (!) 59  Weight: 272 lb (123.4 kg)    Fetal Status:     Movement: Present     General:  Alert, oriented and cooperative. Patient is in no acute distress.  Skin: Skin is warm and dry. No rash noted.   Cardiovascular: Normal heart rate noted  Respiratory: Normal respiratory effort, no problems with respiration noted  Abdomen: Soft, gravid, appropriate for gestational age. Pain/Pressure: Present     Pelvic:  Cervical exam performed      2 cm / 50 % / -3 / Vtx  Extremities: Normal range of motion.  Edema: Mild pitting, slight indentation  Mental Status: Normal mood and affect. Normal behavior. Normal judgment and thought content.   Urinalysis:      Assessment and Plan:  Pregnancy: [id number] at [redacted]w[redacted]d  1. Supervision of high risk pregnancy, antepartum - doing well  2. Diet controlled gestational diabetes mellitus (GDM) in third trimester - patient reports good glucose control with FBS's 70-90 and PP's 90-100  3. Major depressive disorder, single episode, severe (HCC) - clinically stable  4. Obesity during  pregnancy   Term labor symptoms and general obstetric precautions including but not limited to vaginal bleeding, contractions, leaking of fluid and fetal movement were reviewed in detail with the patient. Please refer to After Visit Summary for other counseling recommendations.   Return for postpartum visit.   Brock Bad, MD  04/26/21

## 2021-04-28 ENCOUNTER — Other Ambulatory Visit (HOSPITAL_COMMUNITY)
Admission: RE | Admit: 2021-04-28 | Discharge: 2021-04-28 | Disposition: A | Discharge status: Home / Self Care | Payer: 59 | Source: Ambulatory Visit | Attending: Family Medicine | Admitting: Family Medicine

## 2021-04-28 DIAGNOSIS — Z01812 Encounter for preprocedural laboratory examination: Secondary | ICD-10-CM | POA: Insufficient documentation

## 2021-04-28 DIAGNOSIS — Z20822 Contact with and (suspected) exposure to covid-19: Secondary | ICD-10-CM | POA: Insufficient documentation

## 2021-04-28 LAB — SARS CORONAVIRUS 2 (TAT 6-24 HRS): SARS Coronavirus 2: NEGATIVE

## 2021-04-30 ENCOUNTER — Observation Stay (HOSPITAL_COMMUNITY): Payer: 59 | Admitting: Anesthesiology

## 2021-04-30 ENCOUNTER — Inpatient Hospital Stay (HOSPITAL_COMMUNITY)
Admission: AD | Admit: 2021-04-30 | Discharge: 2021-05-03 | DRG: 807 | Disposition: A | Discharge status: Home / Self Care | Payer: 59 | Attending: Obstetrics and Gynecology | Admitting: Obstetrics and Gynecology

## 2021-04-30 ENCOUNTER — Encounter (HOSPITAL_COMMUNITY): Payer: Self-pay | Admitting: Obstetrics and Gynecology

## 2021-04-30 ENCOUNTER — Other Ambulatory Visit: Payer: Self-pay

## 2021-04-30 ENCOUNTER — Inpatient Hospital Stay (HOSPITAL_COMMUNITY): Payer: 59

## 2021-04-30 DIAGNOSIS — Z6841 Body Mass Index (BMI) 40.0 and over, adult: Secondary | ICD-10-CM

## 2021-04-30 DIAGNOSIS — F17210 Nicotine dependence, cigarettes, uncomplicated: Secondary | ICD-10-CM | POA: Diagnosis present

## 2021-04-30 DIAGNOSIS — Z975 Presence of (intrauterine) contraceptive device: Secondary | ICD-10-CM

## 2021-04-30 DIAGNOSIS — O99334 Smoking (tobacco) complicating childbirth: Secondary | ICD-10-CM | POA: Diagnosis present

## 2021-04-30 DIAGNOSIS — O9081 Anemia of the puerperium: Secondary | ICD-10-CM | POA: Diagnosis not present

## 2021-04-30 DIAGNOSIS — O24420 Gestational diabetes mellitus in childbirth, diet controlled: Principal | ICD-10-CM | POA: Diagnosis present

## 2021-04-30 DIAGNOSIS — O99210 Obesity complicating pregnancy, unspecified trimester: Secondary | ICD-10-CM | POA: Diagnosis present

## 2021-04-30 DIAGNOSIS — F17290 Nicotine dependence, other tobacco product, uncomplicated: Secondary | ICD-10-CM | POA: Diagnosis present

## 2021-04-30 DIAGNOSIS — Z3490 Encounter for supervision of normal pregnancy, unspecified, unspecified trimester: Secondary | ICD-10-CM | POA: Diagnosis present

## 2021-04-30 DIAGNOSIS — F419 Anxiety disorder, unspecified: Secondary | ICD-10-CM | POA: Diagnosis present

## 2021-04-30 DIAGNOSIS — Z30430 Encounter for insertion of intrauterine contraceptive device: Secondary | ICD-10-CM

## 2021-04-30 DIAGNOSIS — F322 Major depressive disorder, single episode, severe without psychotic features: Secondary | ICD-10-CM | POA: Diagnosis present

## 2021-04-30 DIAGNOSIS — Z3A39 39 weeks gestation of pregnancy: Secondary | ICD-10-CM

## 2021-04-30 DIAGNOSIS — O24419 Gestational diabetes mellitus in pregnancy, unspecified control: Secondary | ICD-10-CM | POA: Diagnosis present

## 2021-04-30 DIAGNOSIS — J45909 Unspecified asthma, uncomplicated: Secondary | ICD-10-CM | POA: Diagnosis present

## 2021-04-30 DIAGNOSIS — O99344 Other mental disorders complicating childbirth: Secondary | ICD-10-CM | POA: Diagnosis present

## 2021-04-30 DIAGNOSIS — O0990 Supervision of high risk pregnancy, unspecified, unspecified trimester: Secondary | ICD-10-CM

## 2021-04-30 DIAGNOSIS — O99214 Obesity complicating childbirth: Secondary | ICD-10-CM | POA: Diagnosis present

## 2021-04-30 DIAGNOSIS — O9952 Diseases of the respiratory system complicating childbirth: Secondary | ICD-10-CM | POA: Diagnosis present

## 2021-04-30 LAB — GLUCOSE, CAPILLARY
Glucose-Capillary: 111 mg/dL — ABNORMAL HIGH (ref 70–99)
Glucose-Capillary: 138 mg/dL — ABNORMAL HIGH (ref 70–99)
Glucose-Capillary: 148 mg/dL — ABNORMAL HIGH (ref 70–99)
Glucose-Capillary: 117 mg/dL — ABNORMAL HIGH (ref 70–99)

## 2021-04-30 LAB — CBC
HCT: 32.6 % — ABNORMAL LOW (ref 36.0–46.0)
Hemoglobin: 10.9 g/dL — ABNORMAL LOW (ref 12.0–15.0)
MCH: 31.2 pg (ref 26.0–34.0)
MCHC: 33.4 g/dL (ref 30.0–36.0)
MCV: 93.4 fL (ref 80.0–100.0)
Platelets: 228 10*3/uL (ref 150–400)
RBC: 3.49 MIL/uL — ABNORMAL LOW (ref 3.87–5.11)
RDW: 11.9 % (ref 11.5–15.5)
WBC: 5.4 10*3/uL (ref 4.0–10.5)
nRBC: 0 % (ref 0.0–0.2)

## 2021-04-30 LAB — TYPE AND SCREEN
ABO/RH(D): O POS
Antibody Screen: NEGATIVE

## 2021-04-30 MED ORDER — LIDOCAINE HCL (PF) 1 % IJ SOLN
INTRAMUSCULAR | Status: DC | PRN
  Administered 2021-04-30: 5 mL via EPIDURAL
  Administered 2021-04-30: 2 mL via EPIDURAL
  Administered 2021-04-30: 3 mL via EPIDURAL

## 2021-04-30 MED ORDER — EPHEDRINE 5 MG/ML INJ: 10.0000 mg | INTRAVENOUS | Status: DC | PRN

## 2021-04-30 MED ORDER — LACTATED RINGERS IV SOLN: 500.0000 mL | Freq: Once | INTRAVENOUS | Status: DC

## 2021-04-30 MED ORDER — TERBUTALINE SULFATE 1 MG/ML IJ SOLN: 0.2500 mg | Freq: Once | INTRAMUSCULAR | Status: DC | PRN

## 2021-04-30 MED ORDER — MISOPROSTOL 50MCG HALF TABLET
50.0000 ug | ORAL_TABLET | ORAL | Status: DC
  Administered 2021-04-30: 50 ug via BUCCAL
  Filled 2021-04-30 (×3): qty 1

## 2021-04-30 MED ORDER — LIDOCAINE HCL (PF) 1 % IJ SOLN: 30.0000 mL | INTRAMUSCULAR | Status: DC | PRN

## 2021-04-30 MED ORDER — DIPHENHYDRAMINE HCL 50 MG/ML IJ SOLN: 12.5000 mg | INTRAMUSCULAR | Status: DC | PRN

## 2021-04-30 MED ORDER — LACTATED RINGERS IV SOLN: 500.0000 mL | INTRAVENOUS | Status: DC | PRN

## 2021-04-30 MED ORDER — PHENYLEPHRINE 40 MCG/ML (10ML) SYRINGE FOR IV PUSH (FOR BLOOD PRESSURE SUPPORT)
80.0000 ug | PREFILLED_SYRINGE | INTRAVENOUS | Status: DC | PRN
  Filled 2021-04-30: qty 10

## 2021-04-30 MED ORDER — ONDANSETRON HCL 4 MG/2ML IJ SOLN: 4.0000 mg | Freq: Four times a day (QID) | INTRAMUSCULAR | Status: DC | PRN

## 2021-04-30 MED ORDER — PHENYLEPHRINE 40 MCG/ML (10ML) SYRINGE FOR IV PUSH (FOR BLOOD PRESSURE SUPPORT): 80.0000 ug | PREFILLED_SYRINGE | INTRAVENOUS | Status: DC | PRN

## 2021-04-30 MED ORDER — OXYTOCIN-SODIUM CHLORIDE 30-0.9 UT/500ML-% IV SOLN
2.5000 [IU]/h | INTRAVENOUS | Status: DC
  Filled 2021-04-30: qty 500

## 2021-04-30 MED ORDER — LEVONORGESTREL 20.1 MCG/DAY IU IUD
1.0000 | INTRAUTERINE_SYSTEM | Freq: Once | INTRAUTERINE | Status: AC
  Administered 2021-05-01: 1 via INTRAUTERINE
  Filled 2021-04-30: qty 1

## 2021-04-30 MED ORDER — ACETAMINOPHEN 325 MG PO TABS: 650.0000 mg | ORAL_TABLET | ORAL | Status: DC | PRN

## 2021-04-30 MED ORDER — OXYCODONE-ACETAMINOPHEN 5-325 MG PO TABS: 2.0000 | ORAL_TABLET | ORAL | Status: DC | PRN

## 2021-04-30 MED ORDER — FENTANYL-BUPIVACAINE-NACL 0.5-0.125-0.9 MG/250ML-% EP SOLN
12.0000 mL/h | EPIDURAL | Status: DC | PRN
  Administered 2021-04-30: 12 mL/h via EPIDURAL
  Filled 2021-04-30: qty 250

## 2021-04-30 MED ORDER — LACTATED RINGERS IV SOLN: INTRAVENOUS | Status: DC

## 2021-04-30 MED ORDER — OXYCODONE-ACETAMINOPHEN 5-325 MG PO TABS: 1.0000 | ORAL_TABLET | ORAL | Status: DC | PRN

## 2021-04-30 MED ORDER — FENTANYL CITRATE (PF) 100 MCG/2ML IJ SOLN: 50.0000 ug | INTRAMUSCULAR | Status: DC | PRN

## 2021-04-30 MED ORDER — SOD CITRATE-CITRIC ACID 500-334 MG/5ML PO SOLN
30.0000 mL | ORAL | Status: DC | PRN
Start: 2021-04-30 — End: 2021-05-01

## 2021-04-30 MED ORDER — OXYTOCIN-SODIUM CHLORIDE 30-0.9 UT/500ML-% IV SOLN
1.0000 m[IU]/min | INTRAVENOUS | Status: DC
  Administered 2021-04-30: 2 m[IU]/min via INTRAVENOUS

## 2021-04-30 MED ORDER — TERBUTALINE SULFATE 1 MG/ML IJ SOLN
0.2500 mg | Freq: Once | INTRAMUSCULAR | Status: AC | PRN
  Administered 2021-05-01: 0.25 mg via SUBCUTANEOUS
  Filled 2021-04-30: qty 1

## 2021-04-30 MED ORDER — MISOPROSTOL 25 MCG QUARTER TABLET: 25.0000 ug | ORAL_TABLET | ORAL | Status: DC | PRN

## 2021-04-30 MED ORDER — OXYTOCIN BOLUS FROM INFUSION
333.0000 mL | Freq: Once | INTRAVENOUS | Status: AC
  Administered 2021-05-01: 333 mL via INTRAVENOUS

## 2021-04-30 NOTE — Anesthesia Procedure Notes (Signed)
Epidural Patient location during procedure: OB Start time: 04/30/2021 10:15 PM End time: 04/30/2021 10:22 PM  Staffing Anesthesiologist: Marcene Duos, MD Performed: anesthesiologist   Preanesthetic Checklist Completed: patient identified, IV checked, site marked, risks and benefits discussed, surgical consent, monitors and equipment checked, pre-op evaluation and timeout performed  Epidural Patient position: sitting Prep: DuraPrep and site prepped and draped Patient monitoring: continuous pulse ox and blood pressure Approach: midline Location: L4-L5 Injection technique: LOR air  Needle:  Needle type: Tuohy  Needle gauge: 17 G Needle length: 9 cm and 9 Needle insertion depth: 8 cm Catheter type: closed end flexible Catheter size: 19 Gauge Catheter at skin depth: 16 cm Test dose: negative  Assessment Events: blood not aspirated, injection not painful, no injection resistance, no paresthesia and negative IV test

## 2021-04-30 NOTE — Progress Notes (Signed)
Foley bulb came out around 2003 on 04/30/21. Rechecked cervix at this time, patient is 6/50/-2.  Glucose level elevated, 138. Patient stated she just drank apple juice. Will recheck in 1 hour. Provider notified.   Wendelyn Breslow RN

## 2021-04-30 NOTE — Anesthesia Preprocedure Evaluation (Signed)
Anesthesia Evaluation  Patient identified by MRN, date of birth, ID band Patient awake    Reviewed: Allergy & Precautions, Patient's Chart, lab work & pertinent test results  Airway Mallampati: III  TM Distance: >3 FB     Dental   Pulmonary asthma , Current Smoker,    Pulmonary exam normal        Cardiovascular negative cardio ROS Normal cardiovascular exam     Neuro/Psych negative neurological ROS     GI/Hepatic negative GI ROS, Neg liver ROS,   Endo/Other  diabetes, GestationalMorbid obesity  Renal/GU negative Renal ROS     Musculoskeletal   Abdominal   Peds  Hematology  (+) anemia ,   Anesthesia Other Findings   Reproductive/Obstetrics (+) Pregnancy                             Lab Results  Component Value Date   WBC 5.4 04/30/2021   HGB 10.9 (L) 04/30/2021   HCT 32.6 (L) 04/30/2021   MCV 93.4 04/30/2021   PLT 228 04/30/2021   Lab Results  Component Value Date   CREATININE 0.45 01/13/2021   BUN 5 (L) 01/13/2021   NA 135 01/13/2021   K 3.3 (L) 01/13/2021   CL 102 01/13/2021   CO2 20 (L) 01/13/2021    Anesthesia Physical Anesthesia Plan  ASA: III  Anesthesia Plan: Epidural   Post-op Pain Management:    Induction:   PONV Risk Score and Plan: Treatment may vary due to age or medical condition  Airway Management Planned: Natural Airway  Additional Equipment:   Intra-op Plan:   Post-operative Plan:   Informed Consent: I have reviewed the patients History and Physical, chart, labs and discussed the procedure including the risks, benefits and alternatives for the proposed anesthesia with the patient or authorized representative who has indicated his/her understanding and acceptance.       Plan Discussed with:   Anesthesia Plan Comments:         Anesthesia Quick Evaluation

## 2021-04-30 NOTE — Plan of Care (Signed)

## 2021-04-30 NOTE — H&P (Addendum)
OB ADMISSION/ HISTORY & PHYSICAL:  Admission Date: 04/30/2021  3:20 PM  Admit Diagnosis: Gestational diabetes [O24.419]    Susan Clements is a 30 y.o. female presenting for IOL for A1GDM at [redacted]w[redacted]d. She is well. Feeling some occasional contractions 4/10, but is overall comfortable.  Prenatal History: [id number]   EDC : 05/03/2021, by Last Menstrual Period  Prenatal care at [facility] since 14wks.   Prenatal course complicated by: GDM- well controlled with diet Anxiety- Managed with PRN Vistaril  MDD- (no meds)  BMI- 46 Hx of Asthma; last use of inhaler 1 month ago.   Prenatal Labs: ABO, Rh: O (11/19 1041)  Antibody: PENDING (05/14 1537) Rubella: 1.00 (11/19 1041)  RPR: Non Reactive (03/04 0925)  HBsAg: Negative (11/19 1041)  HIV: Non Reactive (03/04 0925)  GBS: Negative/-- (04/19 0839)  1 hr Glucola : 204  2 hr Glucola: 103 Genetic Screening: Normal  Ultrasound: Vertex AFI normal, AC 96%tile, EFW 3101g 6#13oz at 37wks. Posterior placenta   Vaccines: TDaP         UTD     Maternal Diabetes: Yes:  Diabetes Type:  Diet controlled Genetic Screening: Normal Maternal Ultrasounds/Referrals: Normal Fetal Ultrasounds or other Referrals:  None Maternal Substance Abuse:  No Significant Maternal Medications:  None Significant Maternal Lab Results:  Group B Strep negative Other Comments:  None  Medical / Surgical History :  Past medical history:  Past Medical History:  Diagnosis Date   Asthma    Gestational diabetes      Past surgical history:  Past Surgical History:  Procedure Laterality Date   NO PAST SURGERIES       Family History:  Family History  Problem Relation Age of Onset   Healthy Mother    Diabetes Father    Throat cancer Maternal Grandmother    Hypertension Maternal Grandmother    Diabetes Maternal Grandmother    Heart failure Maternal Grandmother    Hypertension Paternal Grandmother      Social History:  reports that she has been smoking cigars and  cigarettes. She has been smoking about 0.25 packs per day. She has never used smokeless tobacco. She reports previous alcohol use. She reports previous drug use.   Allergies: Nsaids   Current Medications at time of admission:  Medications Prior to Admission  Medication Sig Dispense Refill Last Dose   Accu-Chek Softclix Lancets lancets Check glucose readings by percutaneous route 4 times daily 100 each 12    albuterol (VENTOLIN HFA) 108 (90 Base) MCG/ACT inhaler Inhale 2 puffs into the lungs every 6 (six) hours as needed for wheezing or shortness of breath. 8 g 2    Blood Glucose Monitoring Suppl (ACCU-CHEK GUIDE) w/Device KIT 1 Device by Percutaneous route 4 (four) times daily. 1 kit 0    Blood Pressure Monitoring (BLOOD PRESSURE KIT) DEVI 1 kit by Does not apply route once a week. 1 each 0    glucose blood (ACCU-CHEK GUIDE) test strip Check glucose readings by percutaneous route 4 times daily 100 each 12    hydrOXYzine (VISTARIL) 25 MG capsule Take 1 capsule (25 mg total) by mouth 3 (three) times daily as needed. (Patient not taking: Reported on 04/12/2021) 30 capsule 2    pantoprazole (PROTONIX) 40 MG tablet Take 1 tablet (40 mg total) by mouth daily. (Patient not taking: Reported on 04/12/2021) 90 tablet 1    Prenatal Vit-Fe Fumarate-FA (PREPLUS) 27-1 MG TABS Take 1 tablet by mouth daily. 30 tablet 13      Review  of Systems: Review of Systems  Gastrointestinal: Negative for abdominal pain.  Psychiatric/Behavioral: The patient is not nervous/anxious.   All other systems reviewed and are negative.   Physical Exam: Vital signs and nursing notes reviewed.  Patient Vitals for the past 24 hrs:  BP Pulse Height Weight  04/30/21 1559 -- -- $Rem'5\' 4"'puzl$  (1.626 m) 123 kg  04/30/21 1536 115/61 (!) 105 -- --     General: AAO x 3, NAD, coping well Heart: RRR Lungs:CTAB Abdomen: Gravid, NT, Leopold's verxtx Extremities: mild non-pitting edema Genitalia / VE: Dilation: 2.5 Effacement (%):  Thick Station: -3 Presentation: Vertex Exam by:: Gailen Shelter SNM   FHR: 135 BPM, moderate variability, present accels, absent decels TOCO: Ctx irregular q3-7 mins mild to palpation.   Labs:   Pending T&S, CBC, RPR  Recent Labs    04/30/21 1635  WBC 5.4  HGB 10.9*  HCT 32.6*  PLT 228    Procedure: R/b discussed of IP foley balloon. Pt agreeable to plan. IP Foley balloon placed by SNM with ease. 60cc of LR for balloon.   Assessment:  30 y.o. [id number] at [redacted]w[redacted]d GDM- well controlled with diet Anxiety- Managed with PRN Vistaril  MDD- (no meds)  BMI- 46 Hx of Asthma- Albuterol Inhaler PRN  1. IOL-A1GDM- diet controlled  2. FHR category I 3. GBS Negative 4. Desires epidural, and circ for baby.   5. Breastfeeding 6. Placenta disposal L&D  Plan:  1. Admit to BS 2. Routine L&D orders  -Q4hr CBGs   -Pt may have light labor/carb modified diet 3. Analgesia/anesthesia PRN, may have epidural upon request  4. Active management  -Plan for IP Foley balloon placement.   -Cytotec x1   -Plan for recheck once balloon is expelled.  5. Anticipate NSVB   Dr Roselie Awkward notified of admission / plan of care   Shantonette Isaias Sakai) Rollene Rotunda, BSN, RNC-OB  Student Nurse-Midwife   04/30/2021  5:43 PM    Attestation of Supervision of Student:  I confirm that I have verified the information documented in the nurse midwife student's note and that I have also personally reperformed the history, physical exam and all medical decision making activities.  I have verified that all services and findings are accurately documented in this student's note; and I agree with management and plan as outlined in the documentation. I have also made any necessary editorial changes.   Wende Mott, [place] for [employer], [employer] 04/30/2021 6:32 PM

## 2021-04-30 NOTE — Progress Notes (Signed)
Labor Progress Note Susan Clements is a 30 y.o. [id number] at [redacted]w[redacted]d presented for IOL A1GDM. S: Doing well without complaints.  O:  BP 118/72   Pulse 87   Temp 98.4 F (36.9 C) (Oral)   Resp 16   Ht 5\' 4"  (1.626 m)   Wt 123 kg   LMP 07/27/2020   BMI 46.53 kg/m  EFM: baseline 150bpm/mod variability/+ accels/no decels Toco: difficult to trace  CVE: Dilation: 6 Effacement (%): 50 Station: -3 Presentation: Vertex Exam by:: [email or web address], MD   A&P: 30 y.o. 38 [redacted]w[redacted]d presented for IOL-A1GDM. #IOL: S/p FB and cyto x1. Will start pitocin. Will defer AROM in the setting of fetal station. #Pain: PRN, desires epidural #FWB: cat 1 #GBS negative #A1GDM: EFW 57%ile, 3101g @37w . BGL elevated at last 2 checks 138-->148 although patient recently had apple juice and seafood platter. Counseled patient extensively on risks of PO intake and starting endotool. Counseled on risk of neonatal hypoglycemia. Patient amendable to plan. Will re-check BGL in 1 hour. #contraception: counseled, patient desires post placental liletta, discussed risks/benefits. Consents signed/ordered.  [redacted]w[redacted]d, MD 9:35 PM

## 2021-05-01 ENCOUNTER — Encounter (HOSPITAL_COMMUNITY): Payer: Self-pay | Admitting: Obstetrics and Gynecology

## 2021-05-01 DIAGNOSIS — F419 Anxiety disorder, unspecified: Secondary | ICD-10-CM | POA: Diagnosis present

## 2021-05-01 DIAGNOSIS — O24420 Gestational diabetes mellitus in childbirth, diet controlled: Secondary | ICD-10-CM | POA: Diagnosis present

## 2021-05-01 DIAGNOSIS — O99344 Other mental disorders complicating childbirth: Secondary | ICD-10-CM | POA: Diagnosis present

## 2021-05-01 DIAGNOSIS — O9952 Diseases of the respiratory system complicating childbirth: Secondary | ICD-10-CM | POA: Diagnosis present

## 2021-05-01 DIAGNOSIS — Z30430 Encounter for insertion of intrauterine contraceptive device: Secondary | ICD-10-CM | POA: Diagnosis not present

## 2021-05-01 DIAGNOSIS — Z975 Presence of (intrauterine) contraceptive device: Secondary | ICD-10-CM

## 2021-05-01 DIAGNOSIS — F17210 Nicotine dependence, cigarettes, uncomplicated: Secondary | ICD-10-CM | POA: Diagnosis present

## 2021-05-01 DIAGNOSIS — O9081 Anemia of the puerperium: Secondary | ICD-10-CM | POA: Diagnosis not present

## 2021-05-01 DIAGNOSIS — O99334 Smoking (tobacco) complicating childbirth: Secondary | ICD-10-CM | POA: Diagnosis present

## 2021-05-01 DIAGNOSIS — Z3A39 39 weeks gestation of pregnancy: Secondary | ICD-10-CM | POA: Diagnosis not present

## 2021-05-01 DIAGNOSIS — J45909 Unspecified asthma, uncomplicated: Secondary | ICD-10-CM | POA: Diagnosis present

## 2021-05-01 DIAGNOSIS — O99214 Obesity complicating childbirth: Secondary | ICD-10-CM | POA: Diagnosis present

## 2021-05-01 DIAGNOSIS — F17290 Nicotine dependence, other tobacco product, uncomplicated: Secondary | ICD-10-CM | POA: Diagnosis present

## 2021-05-01 LAB — RPR: RPR Ser Ql: NONREACTIVE

## 2021-05-01 LAB — GLUCOSE, CAPILLARY: Glucose-Capillary: 111 mg/dL — ABNORMAL HIGH (ref 70–99)

## 2021-05-01 MED ORDER — COCONUT OIL OIL: 1.0000 "application " | TOPICAL_OIL | Status: DC | PRN

## 2021-05-01 MED ORDER — WITCH HAZEL-GLYCERIN EX PADS: 1.0000 "application " | MEDICATED_PAD | CUTANEOUS | Status: DC | PRN

## 2021-05-01 MED ORDER — ONDANSETRON HCL 4 MG/2ML IJ SOLN: 4.0000 mg | INTRAMUSCULAR | Status: DC | PRN

## 2021-05-01 MED ORDER — MEASLES, MUMPS & RUBELLA VAC IJ SOLR: 0.5000 mL | Freq: Once | INTRAMUSCULAR | Status: DC

## 2021-05-01 MED ORDER — IBUPROFEN 600 MG PO TABS: 600.0000 mg | ORAL_TABLET | Freq: Four times a day (QID) | ORAL | Status: DC

## 2021-05-01 MED ORDER — SIMETHICONE 80 MG PO CHEW: 80.0000 mg | CHEWABLE_TABLET | ORAL | Status: DC | PRN

## 2021-05-01 MED ORDER — PRENATAL MULTIVITAMIN CH
1.0000 | ORAL_TABLET | Freq: Every day | ORAL | Status: DC
  Administered 2021-05-01 – 2021-05-03 (×3): 1 via ORAL
  Filled 2021-05-01 (×3): qty 1

## 2021-05-01 MED ORDER — DIPHENHYDRAMINE HCL 25 MG PO CAPS: 25.0000 mg | ORAL_CAPSULE | Freq: Four times a day (QID) | ORAL | Status: DC | PRN

## 2021-05-01 MED ORDER — ONDANSETRON HCL 4 MG PO TABS: 4.0000 mg | ORAL_TABLET | ORAL | Status: DC | PRN

## 2021-05-01 MED ORDER — DIBUCAINE (PERIANAL) 1 % EX OINT: 1.0000 "application " | TOPICAL_OINTMENT | CUTANEOUS | Status: DC | PRN

## 2021-05-01 MED ORDER — BENZOCAINE-MENTHOL 20-0.5 % EX AERO: 1.0000 "application " | INHALATION_SPRAY | CUTANEOUS | Status: DC | PRN

## 2021-05-01 MED ORDER — SENNOSIDES-DOCUSATE SODIUM 8.6-50 MG PO TABS
2.0000 | ORAL_TABLET | ORAL | Status: DC
  Administered 2021-05-01 – 2021-05-02 (×2): 2 via ORAL
  Filled 2021-05-01 (×3): qty 2

## 2021-05-01 MED ORDER — ACETAMINOPHEN 325 MG PO TABS
650.0000 mg | ORAL_TABLET | ORAL | Status: DC | PRN
  Administered 2021-05-01 – 2021-05-03 (×9): 650 mg via ORAL
  Filled 2021-05-01 (×9): qty 2

## 2021-05-01 MED ORDER — TETANUS-DIPHTH-ACELL PERTUSSIS 5-2.5-18.5 LF-MCG/0.5 IM SUSY: 0.5000 mL | PREFILLED_SYRINGE | Freq: Once | INTRAMUSCULAR | Status: DC

## 2021-05-01 NOTE — Lactation Note (Signed)
This note was copied from a baby's chart. Lactation Consultation Note  Patient Name: Susan Clements [id number] Date: 05/01/2021 Reason for consult: Initial assessment Age:32 hours Mom standing at bedside, baby asleep and swaddled resting in mom's bed. Mom reports last feeding ~10am, states baby received formula d/t low BS level. Reports plans to exclusively BF. Reports low milk supply in the past and supplemented with formula until supply dried up ~ 58mo with both babies. Mom reports h/o hand expressing to obtain EBM, plans to use a Medela DEBP with this baby.  Baby up skin to skin with mom, mom with large everted nipples bilat, baby does not open mouth wide enough to latch to the breast, baby tries to latch to breast using pursed lips. Tongue makes U-shape on elevation, thin tight frenulum noted, will need to observe a full latch to determine if this is a factor. Mom and LC hand expressed a teaspoon of colostrum, LC fed back to baby using a curved tip syringe and gloved finger. Baby with tongue thrusting and biting, rhythmic and strong suck noted towards end of feeding. Advised if unable to latch baby to breast offer skin to skin and EBM via hand expression and call for [facility] support.  Discussed cue based feedings, expect 8-12 every 24hrs, wake if >4hrs since last feeding, hand express after q feeding and offer back to baby, lots of skin to skin, avoid pacifiers x19mo, allow baby to sleep on back in bassinet vs on pillows in mom's bed. Discussed DBM vs formula, mom prefers formula. Advised avoid formula supplementation unless indicated. Mom voiced understanding and with no further concerns. Left the room with mom resting in bed and baby swaddled in bassinet. BGilliam, RN, IBCLC  Maternal Data Has patient been taught Hand Expression?: No Does the patient have breastfeeding experience prior to this delivery?: Yes How long did the patient breastfeed?: 28mo (child now 74yo), 65mo (child now  11yo)  Feeding Mother's Current Feeding Choice: Breast Milk  LATCH Score Latch: Too sleepy or reluctant, no latch achieved, no sucking elicited.  Audible Swallowing: None  Type of Nipple: Everted at rest and after stimulation  Comfort (Breast/Nipple): Soft / non-tender  Hold (Positioning): Assistance needed to correctly position infant at breast and maintain latch.  LATCH Score: 5    Interventions Interventions: Breast feeding basics reviewed  Discharge Pump: Personal (has Medela DEBP at home) [facility] Program: Yes  Consult Status Consult Status: Follow-up Date: 05/02/21 Follow-up type: In-patient    Charlynn Court 05/01/2021, 7:29 PM

## 2021-05-01 NOTE — Lactation Note (Signed)
This note was copied from a baby's chart. Lactation Consultation Note  Patient Name: Susan Clements [id number] Date: 05/01/2021   Age:32 hours P3, term female infant in L&D. LC entered the room, per mom, infant breastfeed for 5 minutes and infant was being weighed. Per , RN mom will soon be transported to [facility]. LC congratulated mom on birth of infant and discussed with mom, if she need assistance with latching infant at breast on the  MBU ( RN) or [facility] can assist with latch. Per mom, she previously breastfeed both of her children P1 and P2 for 5 months each, her 2nd child is now 61 years old. LC discussed with mom breastfeeding infant according to primal cues: licking, tasting, rooting, hands or fist in mouth, and to breastfeed infant STS. LC briefly discussed infant's input and output and what to expect the first few days of life. Maternal Data    Feeding    LATCH Score                    Lactation Tools Discussed/Used    Interventions    Discharge    Consult Status      Danelle Earthly 05/01/2021, 4:05 AM

## 2021-05-01 NOTE — Progress Notes (Addendum)
  CSW met with MOB to complete consult for hx of major depression and anxiety. CSW observed MOB resting in bed and FOB at bedside. MOB gave CSW verbal consent to complete consult while FOB was present. CSW explained role and reason for consult. MOB was pleasant, polite and engaged with CSW. MOB reported, hx of depression, anxiety, and that she was prescribe Vistaril. MOB reported,  two weeks ago she ran out of medication and plan to follow up once d/c to get refill. MOB reported, until then she has been able to manage symptoms with no medication.   CSW provided education regarding the baby blues period vs. perinatal mood disorders, discussed treatment and gave resources for mental health follow up if concerns arise. CSW recommends self- evaluation during the postpartum time period using the New Mom Checklist from Postpartum Progress and encouraged MOB to contact a medical professional if symptoms are noted at any time.   When CSW asked MOB about her emotions after delivery. MOB reported, she feels "relieve". MOB reported, FOB and children are her supports. MOB denied SI and HI when CSW assessed for safety.   MOB reported, there are no barriers to follow up infant's care. MOB reported, she has all essentials needed to care for infant. MOB reported, infant has a car seat and bassinet. MOB denied any additional barriers.     CSW provided education on Sudden Infant Death Syndrome (SIDS).   CSW identifies no further need for intervention or barriers to discharge at this time.   Darcus Austin, MSW, LCSW-A Clinical Social Worker [phone]

## 2021-05-01 NOTE — Discharge Summary (Signed)
   Postpartum Discharge Summary  Date of Service updated 05/02/21     Patient Name: Susan Clements DOB: 11/19/1991 MRN: [id number]  Date of admission: 04/30/2021 Delivery date:05/01/2021  Delivering provider: FIRESTONE, ALICIA C  Date of discharge: 05/02/2021  Admitting diagnosis: Gestational diabetes [O24.419] [redacted] weeks gestation of pregnancy [Z3A.39] Intrauterine pregnancy: [redacted]w[redacted]d     Secondary diagnosis:  Principal Problem:   Encounter for elective induction of labor Active Problems:   Supervision of high risk pregnancy, antepartum   Obesity during pregnancy   Major depressive disorder, single episode, severe (HCC)   Anxiety   Gestational diabetes   BMI 45.0-49.9, adult (HCC)   Vaginal delivery   IUD (intrauterine device) in place   Shoulder dystocia, delivered   [redacted] weeks gestation of pregnancy  Additional problems: none    Discharge diagnosis: Term Pregnancy Delivered, GDM A1 and Anemia                                              Post partum procedures:post placental liletta Augmentation: AROM, Pitocin, Cytotec and IP Foley Complications: None  Hospital course: Induction of Labor With Vaginal Delivery   30 y.o. yo G5P2022 at [redacted]w[redacted]d was admitted to the hospital 04/30/2021 for induction of labor.  Indication for induction: A1 DM.  Patient had an uncomplicated labor course as follows:  Membrane Rupture Time/Date: 12:29 AM ,05/01/2021   Delivery Method:Vaginal, Vacuum (Extractor)  Episiotomy: None  Lacerations:  1st degree  Details of delivery can be found in separate delivery note.  Patient had a routine postpartum course. Patient is discharged home 05/02/21.  Newborn Data: Birth date:05/01/2021  Birth time:2:12 AM  Gender:Female  Living status:Living  Apgars:8 ,9  Weight:3915 g   Magnesium Sulfate received: No BMZ received: No Rhophylac:N/A MMR:N/A T-DaP:Given prenatally Flu: No Transfusion:No  Physical exam  Vitals:   05/01/21 1345 05/01/21 1840 05/01/21  2000 05/02/21 0500  BP: 128/78 130/84 123/80 119/74  Pulse: 95 90 82 80  Resp: 18 20 18 19  Temp: 98 F (36.7 C) 99.4 F (37.4 C) 99.2 F (37.3 C) 98.3 F (36.8 C)  TempSrc: Oral  Oral Oral  SpO2: 99%  98% 98%  Weight:      Height:       Clements: alert, cooperative and no distress Lochia: appropriate Uterine Fundus: firm Incision: N/A DVT Evaluation: No evidence of DVT seen on physical exam. Labs: Lab Results  Component Value Date   WBC 5.4 04/30/2021   HGB 10.9 (L) 04/30/2021   HCT 32.6 (L) 04/30/2021   MCV 93.4 04/30/2021   PLT 228 04/30/2021   CMP Latest Ref Rng & Units 01/13/2021  Glucose 70 - 99 mg/dL 89  BUN 6 - 20 mg/dL 5(L)  Creatinine 0.44 - 1.00 mg/dL 0.45  Sodium 135 - 145 mmol/L 135  Potassium 3.5 - 5.1 mmol/L 3.3(L)  Chloride 98 - 111 mmol/L 102  CO2 22 - 32 mmol/L 20(L)  Calcium 8.9 - 10.3 mg/dL 9.7  Total Protein 6.0 - 8.5 g/dL -  Total Bilirubin 0.0 - 1.2 mg/dL -  Alkaline Phos 44 - 121 IU/L -  AST 0 - 40 IU/L -  ALT 0 - 32 IU/L -   Edinburgh Score: Edinburgh Postnatal Depression Scale Screening Tool 05/01/2021  I have been able to laugh and see the funny side of things. (No Data)       After visit meds:  Allergies as of 05/02/2021      Reactions   Nsaids       Medication List    STOP taking these medications   Accu-Chek Guide test strip Generic drug: glucose blood   Accu-Chek Guide w/Device Kit   Accu-Chek Softclix Lancets lancets   hydrOXYzine 25 MG capsule Commonly known as: Vistaril   pantoprazole 40 MG tablet Commonly known as: Protonix     TAKE these medications   acetaminophen 500 MG tablet Commonly known as: TYLENOL Take 2 tablets (1,000 mg total) by mouth every 4 (four) hours as needed (for pain scale < 4).   albuterol 108 (90 Base) MCG/ACT inhaler Commonly known as: VENTOLIN HFA Inhale 2 puffs into the lungs every 6 (six) hours as needed for wheezing or shortness of breath.   Blood Pressure Kit Devi 1 kit by Does  not apply route once a week.   coconut oil Oil Apply 1 application topically as needed.   PrePLUS 27-1 MG Tabs Take 1 tablet by mouth daily.        Discharge home in stable condition Infant Feeding: Breast Infant Disposition:home with mother Discharge instruction: per After Visit Summary and Postpartum booklet. Activity: Advance as tolerated. Pelvic rest for 6 weeks.  Diet: routine diet Future Appointments:No future appointments. Follow up Visit: Message sent to [facility] 05/01/21 by [facility].   Please schedule this patient for a In person postpartum visit in 6 weeks with the following provider: Any provider. Additional Postpartum F/U:Postpartum Depression checkup and 2 hour GTT  High risk pregnancy complicated by: GDM Delivery mode:  Vaginal, Vacuum (Extractor)  Anticipated Birth Control:  PP IUD placed, needs string check at pp visit   05/02/2021 Alicia C Firestone, MD   

## 2021-05-01 NOTE — Anesthesia Postprocedure Evaluation (Signed)
Anesthesia Post Note  Patient: Susan Clements  Procedure(s) Performed: AN AD HOC LABOR EPIDURAL     Patient location during evaluation: Mother Baby Anesthesia Type: Epidural Level of consciousness: awake and alert, oriented and patient cooperative Pain management: pain level controlled Vital Signs Assessment: post-procedure vital signs reviewed and stable Respiratory status: spontaneous breathing Cardiovascular status: stable Postop Assessment: no headache, epidural receding, patient able to bend at knees and no signs of nausea or vomiting Anesthetic complications: no Comments: Pt. States she is walking. Pain score 4.    No complications documented.  Last Vitals:  Vitals:   05/01/21 0515 05/01/21 0915  BP: 135/83 138/74  Pulse: 95 99  Resp: 18 18  Temp: 37.2 C 37.1 C  SpO2: 100% 99%    Last Pain:  Vitals:   05/01/21 1150  TempSrc:   PainSc: 8    Pain Goal:                   [facility]

## 2021-05-01 NOTE — Discharge Instructions (Signed)
Levonorgestrel intrauterine device (IUD) What is this medicine? LEVONORGESTREL IUD (LEE voe nor jes trel) is a contraceptive (birth control) device. The device is placed inside the uterus by a health care provider. It is used to prevent pregnancy. Some devices can also be used to treat heavy bleeding that occurs during your period. This medicine may be used for other purposes; ask your health care provider or pharmacist if you have questions. COMMON BRAND NAME(S): Kyleena, LILETTA, Mirena, Skyla What should I tell my health care provider before I take this medicine? They need to know if you have any of these conditions:  abnormal Pap smear  cancer of the breast, uterus, or cervix  diabetes  endometritis  genital or pelvic infection now or in the past  have more than one sexual partner or your partner has more than one partner  heart disease  history of an ectopic or tubal pregnancy  immune system problems  IUD in place  liver disease or tumor  problems with blood clots or take blood-thinners  seizures  use intravenous drugs  uterus of unusual shape  vaginal bleeding that has not been explained  an unusual or allergic reaction to levonorgestrel, other hormones, silicone, or polyethylene, medicines, foods, dyes, or preservatives  pregnant or trying to get pregnant  breast-feeding How should I use this medicine? This device is placed inside the uterus by a health care professional. Talk to your pediatrician regarding the use of this medicine in children. Special care may be needed. Overdosage: If you think you have taken too much of this medicine contact a poison control center or emergency room at once. NOTE: This medicine is only for you. Do not share this medicine with others. What if I miss a dose? This does not apply. Depending on the brand of device you have inserted, the device will need to be replaced every 3 to 7 years if you wish to continue using this type  of birth control. What may interact with this medicine? Do not take this medicine with any of the following medications:  amprenavir  bosentan  fosamprenavir This medicine may also interact with the following medications:  aprepitant  armodafinil  barbiturate medicines for inducing sleep or treating seizures  bexarotene  boceprevir  griseofulvin  medicines to treat seizures like carbamazepine, ethotoin, felbamate, oxcarbazepine, phenytoin, topiramate  modafinil  pioglitazone  rifabutin  rifampin  rifapentine  some medicines to treat HIV infection like atazanavir, efavirenz, indinavir, lopinavir, nelfinavir, tipranavir, ritonavir  St. John's wort  warfarin This list may not describe all possible interactions. Give your health care provider a list of all the medicines, herbs, non-prescription drugs, or dietary supplements you use. Also tell them if you smoke, drink alcohol, or use illegal drugs. Some items may interact with your medicine. What should I watch for while using this medicine? Visit your doctor or health care professional for regular check ups. See your doctor if you or your partner has sexual contact with others, becomes HIV positive, or gets a sexual transmitted disease. This product does not protect you against HIV infection (AIDS) or other sexually transmitted diseases. You can check the placement of the IUD yourself by reaching up to the top of your vagina with clean fingers to feel the threads. Do not pull on the threads. It is a good habit to check placement after each menstrual period. Call your doctor right away if you feel more of the IUD than just the threads or if you cannot feel the threads   at all. The IUD may come out by itself. You may become pregnant if the device comes out. If you notice that the IUD has come out use a backup birth control method like condoms and call your health care provider. Using tampons will not change the position of the  IUD and are okay to use during your period. This IUD can be safely scanned with magnetic resonance imaging (MRI) only under specific conditions. Before you have an MRI, tell your healthcare provider that you have an IUD in place, and which type of IUD you have in place. What side effects may I notice from receiving this medicine? Side effects that you should report to your doctor or health care professional as soon as possible:  allergic reactions like skin rash, itching or hives, swelling of the face, lips, or tongue  fever, flu-like symptoms  genital sores  high blood pressure  no menstrual period for 6 weeks during use  pain, swelling, warmth in the leg  pelvic pain or tenderness  severe or sudden headache  signs of pregnancy  stomach cramping  sudden shortness of breath  trouble with balance, talking, or walking  unusual vaginal bleeding, discharge  yellowing of the eyes or skin Side effects that usually do not require medical attention (report to your doctor or health care professional if they continue or are bothersome):  acne  breast pain  change in sex drive or performance  changes in weight  cramping, dizziness, or faintness while the device is being inserted  headache  irregular menstrual bleeding within first 3 to 6 months of use  nausea This list may not describe all possible side effects. Call your doctor for medical advice about side effects. You may report side effects to FDA at 1-800-FDA-1088. Where should I keep my medicine? This does not apply. NOTE: This sheet is a summary. It may not cover all possible information. If you have questions about this medicine, talk to your doctor, pharmacist, or health care provider.  2021 Elsevier/Gold Standard (2020-08-03 16:27:45) Postpartum Care After Vaginal Delivery The following information offers guidance about how to care for yourself from the time you deliver your baby to 6-12 weeks after delivery  (postpartum period). If you have problems or questions, contact your health care provider for more specific instructions. Follow these instructions at home: Vaginal bleeding  It is normal to have vaginal bleeding (lochia) after delivery. Wear a sanitary pad for bleeding and discharge. ? During the first week after delivery, the amount and appearance of lochia is often similar to a menstrual period. ? Over the next few weeks, it will gradually decrease to a dry, yellow-brown discharge. ? For most women, lochia stops completely by 4-6 weeks after delivery, but can vary.  Change your sanitary pads frequently. Watch for any changes in your flow, such as: ? A sudden increase in volume. ? A change in color. ? Large blood clots.  If you pass a blood clot from your vagina, save it and call your health care provider. Do not flush blood clots down the toilet before talking with your health care provider.  Do not use tampons or douches until your health care provider approves.  If you are not breastfeeding, your period should return 6-8 weeks after delivery. If you are feeding your baby breast milk only, your period may not return until you stop breastfeeding. Perineal care  Keep the area between the vagina and the anus (perineum) clean and dry. Use medicated pads and pain-relieving  sprays and creams as directed.  If you had a surgical cut in the perineum (episiotomy) or a tear, check the area for signs of infection until you are healed. Check for: ? More redness, swelling, or pain. ? Fluid or blood coming from the cut or tear. ? Warmth. ? Pus or a bad smell.  You may be given a squirt bottle to use instead of wiping to clean the perineum area after you use the bathroom. Pat the area gently to dry it.  To relieve pain caused by an episiotomy, a tear, or swollen veins in the anus (hemorrhoids), take a warm sitz bath 2-3 times a day. In a sitz bath, the warm water should only come up to your hips  and cover your buttocks.   Breast care  In the first few days after delivery, your breasts may feel heavy, full, and uncomfortable (breast engorgement). Milk may also leak from your breasts. Ask your health care provider about ways to help relieve the discomfort.  If you are breastfeeding: ? Wear a bra that supports your breasts and fits well. Use breast pads to absorb milk that leaks. ? Keep your nipples clean and dry. Apply creams and ointments as told. ? You may have uterine contractions every time you breastfeed for up to several weeks after delivery. This helps your uterus return to its normal size. ? If you have any problems with breastfeeding, notify your health care provider or lactation consultant.  If you are not breastfeeding: ? Avoid touching your breasts. Do not squeeze out (express) milk. Doing this can make your breasts produce more milk. ? Wear a good-fitting bra and use cold packs to help with swelling. Intimacy and sexuality  Ask your health care provider when you can engage in sexual activity. This may depend upon: ? Your risk of infection. ? How fast you are healing. ? Your comfort and desire to engage in sexual activity.  You are able to get pregnant after delivery, even if you have not had your period. Talk with your health care provider about methods of birth control (contraception) or family planning if you desire future pregnancies. Medicines  Take over-the-counter and prescription medicines only as told by your health care provider.  Take an over-the-counter stool softener to help ease bowel movements as told by your health care provider.  If you were prescribed an antibiotic medicine, take it as told by your health care provider. Do not stop taking the antibiotic even if you start to feel better.  Review all previous and current prescriptions to check for possible transfer into breast milk. Activity  Gradually return to your normal activities as told by  your health care provider.  Rest as much as possible. Nap while your baby is sleeping. Eating and drinking  Drink enough fluid to keep your urine pale yellow.  To help prevent or relieve constipation, eat high-fiber foods every day.  Choose healthy eating to support breastfeeding or weight loss goals.  Take your prenatal vitamins until your health care provider tells you to stop.   General tips/recommendations  Do not use any products that contain nicotine or tobacco. These products include cigarettes, chewing tobacco, and vaping devices, such as e-cigarettes. If you need help quitting, ask your health care provider.  Do not drink alcohol, especially if you are breastfeeding.  Do not take medications or drugs that are not prescribed to you, especially if you are breastfeeding.  Visit your health care provider for a postpartum checkup within   the first 3-6 weeks after delivery.  Complete a comprehensive postpartum visit no later than 12 weeks after delivery.  Keep all follow-up visits for you and your baby. Contact a health care provider if:  You feel unusually sad or worried.  Your breasts become red, painful, or hard.  You have a fever or other signs of an infection.  You have bleeding that is soaking through one pad an hour or you have blood clots.  You have a severe headache that doesn't go away or you have vision changes.  You have nausea and vomiting and are unable to eat or drink anything for 24 hours. Get help right away if:  You have chest pain or difficulty breathing.  You have sudden, severe leg pain.  You faint or have a seizure.  You have thoughts about hurting yourself or your baby. If you ever feel like you may hurt yourself or others, or have thoughts about taking your own life, get help right away. Go to your nearest emergency department or:  Call your local emergency services (911 in the [place]  The National Suicide Prevention Lifeline at  703-376-6987. This suicide crisis helpline is open 24 hours a day.  Text the Crisis Text Line at 640-327-8406 (in the [place] Summary  The period of time after you deliver your newborn up to 6-12 weeks after delivery is called the postpartum period.  Keep all follow-up visits for you and your baby.  Review all previous and current prescriptions to check for possible transfer into breast milk.  Contact a health care provider if you feel unusually sad or worried during the postpartum period. This information is not intended to replace advice given to you by your health care provider. Make sure you discuss any questions you have with your health care provider. Document Revised: 08/19/2020 Document Reviewed: 08/19/2020 Elsevier Patient Education  2021 [employer].

## 2021-05-01 NOTE — Procedures (Signed)
  Post-Placental IUD Insertion Procedure Note  Patient identified, informed consent signed prior to delivery, signed copy in chart, time out was performed.    Vaginal, labial and perineal areas thoroughly inspected for lacerations. First degree laceration identified - not hemostatic, not repaired prior to insertion of IUD.    Liletta  - IUD grasped between sterile gloved fingers. Sterile lubrication applied to sterile gloved hand for ease of insertion. Fundus identified through abdominal wall using non-insertion hand. IUD inserted to fundus with bimanual technique. IUD carefully released at the fundus and insertion hand gently removed from vagina.  Strings trimmed to the level of the introitus. Patient tolerated procedure well.  Patient given post procedure instructions and IUD care card with expiration date.  Patient is asked to keep IUD strings tucked in her vagina until her postpartum follow up visit in 4-6 weeks. Patient advised to abstain from sexual intercourse and pulling on strings before her follow-up visit. Patient verbalized an understanding of the plan of care and agrees.   Alric Seton, MD OB Fellow, Faculty [facility], [facility] 05/01/2021 3:18 AM

## 2021-05-01 NOTE — Progress Notes (Signed)
Labor Progress Note Susan Clements is a 30 y.o. [id number] at [redacted]w[redacted]d presented for IOL A1GDM. S: Doing well without complaints.  O:  BP 120/64   Pulse 75   Temp 98.4 F (36.9 C) (Oral)   Resp 16   Ht 5\' 4"  (1.626 m)   Wt 123 kg   LMP 07/27/2020   SpO2 98%   BMI 46.53 kg/m  EFM: baseline 150bpm/mod variability/+ accels/no decels Toco: difficult to trace  CVE: Dilation: 8 Effacement (%): 90 Station: -1 Presentation: Vertex Exam by:: [email or web address] RN   A&P: 30 y.o. 38 [redacted]w[redacted]d presented for IOL-A1GDM. #IOL: S/p FB and cyto x1. Pitocin started at 2130. AROM performed with this exam. #Pain: epidural #FWB: cat 1 #GBS negative #A1GDM: EFW 57%ile, 3101g @37w . BGL 111 at last check after patient counseled on dietary changes. #contraception: counseled, patient desires post placental liletta, discussed risks/benefits. Consents signed/ordered.  2131, MD 1:16 AM

## 2021-05-02 DIAGNOSIS — Z3A39 39 weeks gestation of pregnancy: Secondary | ICD-10-CM

## 2021-05-02 LAB — GLUCOSE, CAPILLARY: Glucose-Capillary: 103 mg/dL — ABNORMAL HIGH (ref 70–99)

## 2021-05-02 MED ORDER — COCONUT OIL OIL: 1.0000 "application " | TOPICAL_OIL | 0 refills | Status: DC | PRN

## 2021-05-02 MED ORDER — ACETAMINOPHEN 500 MG PO TABS: 1000.0000 mg | ORAL_TABLET | ORAL | Status: DC | PRN

## 2021-05-02 NOTE — Lactation Note (Signed)
This note was copied from a baby's chart. Lactation Consultation Note  Patient Name: Susan Clements MVHQI'O Date: 05/02/2021 Reason for consult: Follow-up assessment;Term Age:32 hours   P3 mother whose infant is now 60 hours old.  This is a term baby at 39+5 weeks.  Mother breast fed her other two children (now 54 and 24 years old) for 5 months each.  Baby was under bili blanket when I arrived.  Mother reported both of her other children required phototherapy treatment.  Last bilirubin level is 10 mg.dl at 26 hours of life.  Mother is not interested in breast feeding in the hospital and desires to bottle feed only.  She will work on breast feeding after she arrives home.  Discussed the importance of breast stimulation via the DEBP if mother is not interested in latching.  Suggested she begin pumping every three hours for stimulation.  Mother willing to do this.  Pump parts, assembly, disassembly and cleaning reviewed.  #24 flange size is appropriate at this time, however, I anticipate mother needing to increase to a #27 with more stimulation.  She is aware of how to observe for correct flange size.  Coconut oil provided for comfort.    Mother will pump for 15 minutes every three hours.  She is able to hand express and I suggested this before/after pumping to aid in milk supply.  She will call her RN for any questions.  No support person present at this time.  Father will return later today.  Mother has a Medela DEBP for home use.  RN updated.   Maternal Data Has patient been taught Hand Expression?: Yes  Feeding Mother's Current Feeding Choice: Breast Milk and Formula  LATCH Score                    Lactation Tools Discussed/Used    Interventions    Discharge Pump: DEBP;Manual;Personal  Consult Status Consult Status: Complete Date: 05/02/21 Follow-up type: In-patient    Polk Minor R Benedicto Capozzi 05/02/2021, 1:14 PM

## 2021-05-03 NOTE — Lactation Note (Signed)
This note was copied from a baby's chart. Lactation Consultation Note  Patient Name: Susan Clements VFIEP'P Date: 05/03/2021 Reason for consult: Follow-up assessment;Term Age:32 hours   P3 mother whose infant is now 32 hours old.  This is a term baby at 39+5 weeks.  Mother breast fed her other two children (now 110 and 23 years old) for 5 months each.  Mother is formula feeding only at this time and plans to latch and breast feed after discharge.  Initiated pumping with the DEBP yesterday.  Baby was placed under phototherapy yesterday morning for a bilirubin of 10 mg/dl at 26 hours of life.  Bilirubin level was 10.8 mg/dl at 37 hours and is now 11.4 mg/dl at 53 hours of life (borderline low intermediate risk zone).  Mother has been feeding adequate volumes of formula without difficulty.  She has started to obtain drops of colostrum, especially from the right breast.  Informed me that her left breast never really produced much milk with her other children.  Engorgement prevention/treatment reviewed.  Encouraged mother to continue to latch at home followed by supplementation as needed.  Suggested she continue to pump every three hours and include hand expression.    Mother has a DEBP for home use.  No support person present at this time.  She has our OP phone number for any further questions/concerns.   Maternal Data Has patient been taught Hand Expression?: Yes Does the patient have breastfeeding experience prior to this delivery?: Yes How long did the patient breastfeed?: 5 months with each of her other two children  Feeding Mother's Current Feeding Choice: Breast Milk and Formula Nipple Type: Slow - flow  LATCH Score                    Lactation Tools Discussed/Used    Interventions Interventions: Education  Discharge Discharge Education: Engorgement and breast care Pump: DEBP;Manual;Personal  Consult Status Consult Status: Complete Date: 05/03/21 Follow-up  type: Call as needed    Atreyu Mak R Adilene Areola 05/03/2021, 8:47 AM

## 2021-05-03 NOTE — Discharge Summary (Signed)
Postpartum Discharge Summary  Date of Service updated 05/03/21                           Patient Name: Susan Clements DOB: [birth year] MRN: [id number]  Date of admission: 04/30/2021 Delivery date:05/01/2021  Delivering provider: Arrie Senate  Date of discharge: 05/02/2021  Admitting diagnosis: Gestational diabetes [O24.419] [redacted] weeks gestation of pregnancy [Z3A.39] Intrauterine pregnancy: [redacted]w[redacted]d     Secondary diagnosis:  Principal Problem:   Encounter for elective induction of labor Active Problems:   Supervision of high risk pregnancy, antepartum   Obesity during pregnancy   Major depressive disorder, single episode, severe ([place])   Anxiety   Gestational diabetes   BMI 45.0-49.9, adult ([place])   Vaginal delivery   IUD (intrauterine device) in place   Shoulder dystocia, delivered   [redacted] weeks gestation of pregnancy  Additional problems: none                            Discharge diagnosis: Term Pregnancy Delivered, GDM A1 and Anemia                                              Post partum procedures:post placental liletta Augmentation: AROM, Pitocin, Cytotec and IP Foley Complications: None  Hospital course: Induction of Labor With Vaginal Delivery   30 y.o. yo [id number] at [redacted]w[redacted]d was admitted to the hospital 04/30/2021 for induction of labor.  Indication for induction: A1 DM.  Patient had an uncomplicated labor course as follows:  Membrane Rupture Time/Date: 12:29 AM ,05/01/2021   Delivery Method:Vaginal, Vacuum (Extractor)  Episiotomy: None  Lacerations:  1st degree  Details of delivery can be found in separate delivery note.  Patient had a routine postpartum course. Patient is discharged home 05/02/21.  Newborn Data: Birth date:05/01/2021  Birth time:2:12 AM  Gender:Female  Living status:Living  Apgars:8 ,9  Weight:3915 g   Magnesium Sulfate received: No BMZ received: No Rhophylac:N/A MMR:N/A T-DaP:Given prenatally Flu: No Transfusion:No  Physical exam         Vitals:   05/01/21 1345 05/01/21 1840 05/01/21 2000 05/02/21 0500  BP: 128/78 130/84 123/80 119/74  Pulse: 95 90 82 80  Resp: $Remo'18 20 18 19  'PPEcp$ Temp: 98 F (36.7 C) 99.4 F (37.4 C) 99.2 F (37.3 C) 98.3 F (36.8 C)  TempSrc: Oral  Oral Oral  SpO2: 99%  98% 98%  Weight:      Height:       General: alert, cooperative and no distress Lochia: appropriate Uterine Fundus: firm Incision: N/A DVT Evaluation: No evidence of DVT seen on physical exam. Labs: Recent Labs       Lab Results  Component Value Date   WBC 5.4 04/30/2021   HGB 10.9 (L) 04/30/2021   HCT 32.6 (L) 04/30/2021   MCV 93.4 04/30/2021   PLT 228 04/30/2021     CMP Latest Ref Rng & Units 01/13/2021  Glucose 70 - 99 mg/dL 89  BUN 6 - 20 mg/dL 5(L)  Creatinine 0.44 - 1.00 mg/dL 0.45  Sodium 135 - 145 mmol/L 135  Potassium 3.5 - 5.1 mmol/L 3.3(L)  Chloride 98 - 111 mmol/L 102  CO2 22 - 32 mmol/L 20(L)  Calcium 8.9 - 10.3 mg/dL 9.7  Total Protein 6.0 - 8.5 g/dL -  Total  Bilirubin 0.0 - 1.2 mg/dL -  Alkaline Phos 44 - 121 IU/L -  AST 0 - 40 IU/L -  ALT 0 - 32 IU/L -   Edinburgh Score: Edinburgh Postnatal Depression Scale Screening Tool 05/01/2021  I have been able to laugh and see the funny side of things. (No Data)     After visit meds:  Allergies as of 05/02/2021      Reactions   Nsaids          Medication List    STOP taking these medications   Accu-Chek Guide test strip Generic drug: glucose blood   Accu-Chek Guide w/Device Kit   Accu-Chek Softclix Lancets lancets   hydrOXYzine 25 MG capsule Commonly known as: Vistaril   pantoprazole 40 MG tablet Commonly known as: Protonix     TAKE these medications   acetaminophen 500 MG tablet Commonly known as: TYLENOL Take 2 tablets (1,000 mg total) by mouth every 4 (four) hours as needed (for pain scale < 4).   albuterol 108 (90 Base) MCG/ACT inhaler Commonly known as: VENTOLIN HFA Inhale 2 puffs into  the lungs every 6 (six) hours as needed for wheezing or shortness of breath.   Blood Pressure Kit Devi 1 kit by Does not apply route once a week.   coconut oil Oil Apply 1 application topically as needed.   PrePLUS 27-1 MG Tabs Take 1 tablet by mouth daily.        Discharge home in stable condition Infant Feeding: Breast Infant Disposition:home with mother Discharge instruction: per After Visit Summary and Postpartum booklet. Activity: Advance as tolerated. Pelvic rest for 6 weeks.  Diet: routine diet Future Appointments:No future appointments. Follow up Visit: Message sent to [facility] 05/01/21 by Sylvester Harder.   Please schedule this patient for a In person postpartum visit in 6 weeks with the following provider: Any provider. Additional Postpartum F/U:Postpartum Depression checkup and 2 hour GTT  High risk pregnancy complicated by: GDM Delivery mode: Vaginal, Vacuum Neurosurgeon)  Anticipated Birth Control: PP IUD placed, needs string check at pp visit  Phill Myron, D.O. 05/03/2021, 9:33 AM

## 2021-05-17 ENCOUNTER — Encounter: Payer: Medicaid Other | Admitting: Licensed Clinical Social Worker

## 2021-05-17 ENCOUNTER — Telehealth: Payer: Self-pay | Admitting: Licensed Clinical Social Worker

## 2021-05-17 NOTE — Telephone Encounter (Signed)
Called pt regarding scheduled appt. Left message for callback

## 2021-06-13 ENCOUNTER — Other Ambulatory Visit: Payer: Medicaid Other

## 2021-06-13 ENCOUNTER — Encounter: Payer: Self-pay | Admitting: Obstetrics and Gynecology

## 2021-06-13 ENCOUNTER — Ambulatory Visit (INDEPENDENT_AMBULATORY_CARE_PROVIDER_SITE_OTHER): Payer: 59 | Admitting: Obstetrics and Gynecology

## 2021-06-13 ENCOUNTER — Other Ambulatory Visit: Payer: Self-pay

## 2021-06-13 VITALS — BP 123/66 | HR 73 | Ht 64.0 in | Wt 242.9 lb

## 2021-06-13 DIAGNOSIS — Z975 Presence of (intrauterine) contraceptive device: Secondary | ICD-10-CM | POA: Diagnosis not present

## 2021-06-13 NOTE — Progress Notes (Signed)
Post Partum Visit Note  Susan Clements is a 30 y.o. [id number] female who presents for a postpartum visit. She is 6 weeks postpartum following a vacuum-assisted vaginal delivery.  I have fully reviewed the prenatal and intrapartum course. The delivery was at 39.5  gestational weeks.  Anesthesia: epidural. Postpartum course has been uncomplicated. Baby is doing well. Baby is feeding by both breast and bottle - Gerber . Bleeding no bleeding. Bowel function is normal. Bladder function is normal. Patient is not sexually active. Contraception method is IUD. Postpartum depression screening: negative.   The pregnancy intention screening data noted above was reviewed. Potential methods of contraception were discussed. The patient elected to proceed with IUD or IUS.     Health Maintenance Due  Topic Date Due   COVID-19 Vaccine (1) Never done   Pneumococcal Vaccine 23-65 Years old (1 - PCV) Never done   URINE MICROALBUMIN  Never done    The following portions of the patient's history were reviewed and updated as appropriate: allergies, current medications, past family history, past medical history, past social history, past surgical history, and problem list.  Review of Systems Pertinent items are noted in HPI.  Objective:  BP 123/66   Pulse 73   Ht 5\' 4"  (1.626 m)   Wt 242 lb 14.4 oz (110.2 kg)   LMP 06/03/2021 (Exact Date)   BMI 41.69 kg/m    General:  alert, cooperative, no distress, and mildly obese   Breasts:  not indicated  Lungs: clear to auscultation bilaterally  Heart:  regular rate and rhythm  Abdomen: soft, non-tender; bowel sounds normal; no masses,  no organomegaly   Wound Vaginal laceration well healed  GU exam:  normal      IUD strings easily seen, no portion of IUD shaft noted, IUD strings trimmed to 2-3 cm centimeters out of cervix. Assessment:    Encounter for postpartum exam IUD strings trimmed at this visit Normal postpartum exam.   Plan:   Essential  components of care per ACOG recommendations:  1.  Mood and well being: Patient with negative depression screening today. Reviewed local resources for support.  - Patient tobacco use? No.   - hx of drug use? No.    2. Infant care and feeding:  -Patient currently breastmilk feeding? Yes. Discussed returning to work and pumping. Reviewed importance of draining breast regularly to support lactation.  -Social determinants of health (SDOH) reviewed in EPIC. The following needs were identified: smoking, pt did smoke pt has quit during her pregnancy and has not resumed  3. Sexuality, contraception and birth spacing - Patient does not want a pregnancy in the next year.  Desired family size is 5 children.  - Reviewed forms of contraception in tiered fashion. Patient desired IUD today.   - Discussed birth spacing of 18 months  4. Sleep and fatigue -Encouraged family/partner/community support of 4 hrs of uninterrupted sleep to help with mood and fatigue  5. Physical Recovery  - Discussed patients delivery and complications. She describes her labor as good. - Patient had a  vacuum assisted vaginal delivery . Patient had a 1st degree laceration. Perineal healing reviewed. Patient expressed understanding - Patient has urinary incontinence? No. - Patient is safe to resume physical and sexual activity  6.  Health Maintenance - HM due items addressed Yes - Last pap smear  Diagnosis  Date Value Ref Range Status  11/05/2020   Final   - Negative for intraepithelial lesion or malignancy (NILM)  Pap smear not done at today's visit.   7. Chronic Disease/Pregnancy Condition follow up: Gestational Diabetes 2 hour GTT today   F/u in 1 year for annual exam  Warden Fillers, MD [facility] [employer], [facility]

## 2021-06-14 LAB — GLUCOSE TOLERANCE, 2 HOURS
Glucose, 2 hour: 72 mg/dL (ref 65–139)
Glucose, GTT - Fasting: 93 mg/dL (ref 65–99)

## 2022-04-25 IMAGING — US US MFM OB FOLLOW-UP
1 series · 14 of 28 positions shown · non-contrast
Comparison: none

[Series 1: us mfm ob follow-up · 41 acquisitions, 14 frames shown]
[im 2/41]
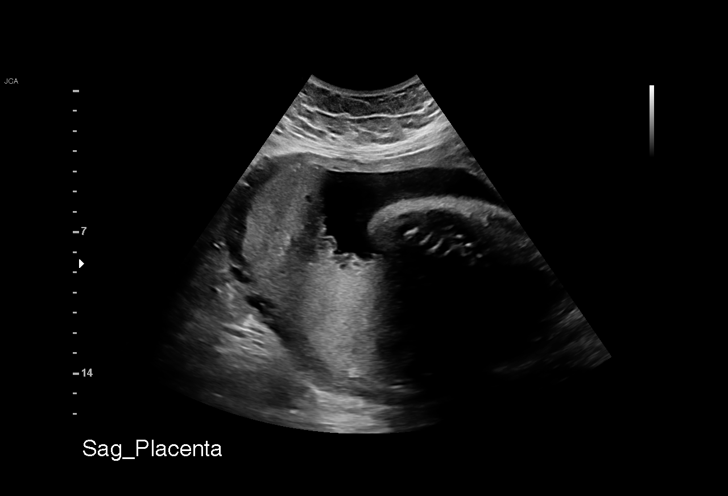
[im 5/41]
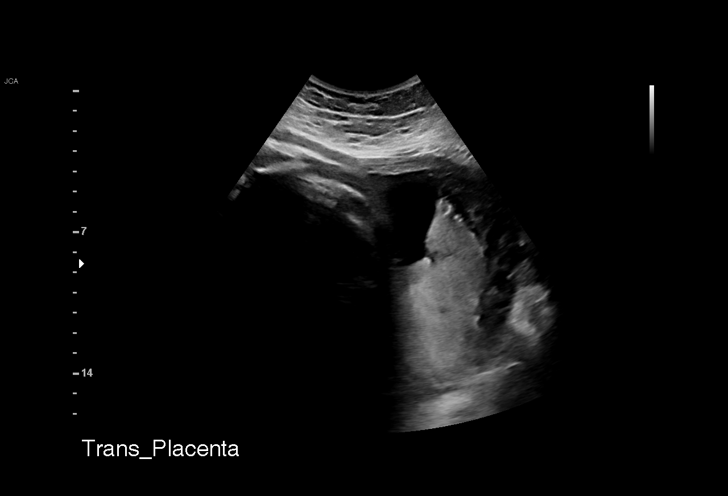
[im 8/41]
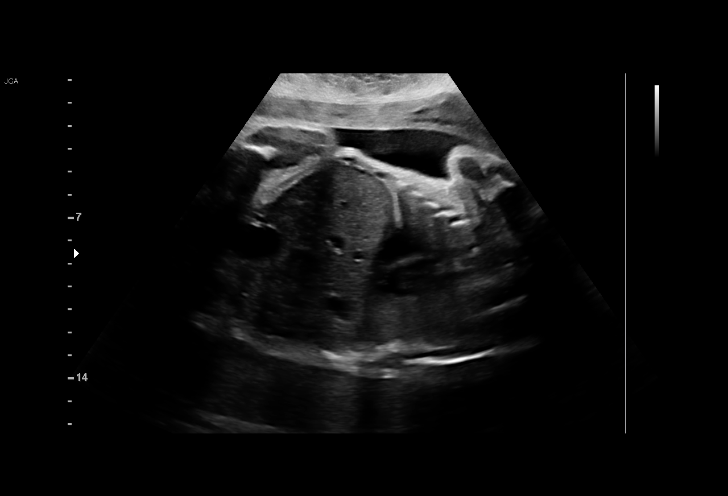
[im 11/41]
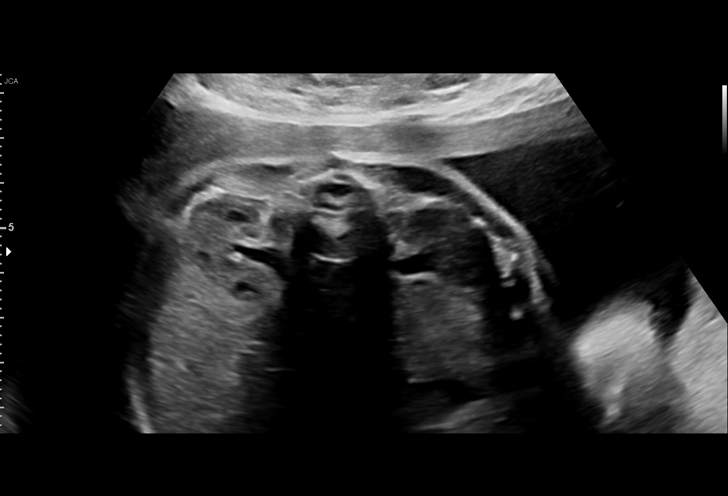
[im 14/41]
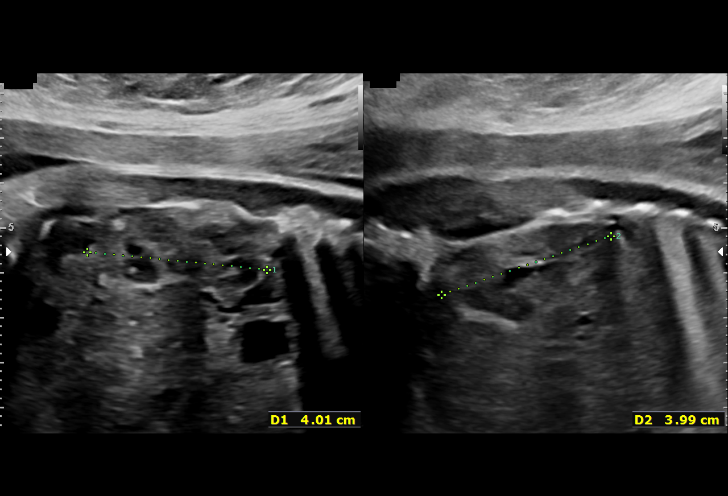
[im 17/41]
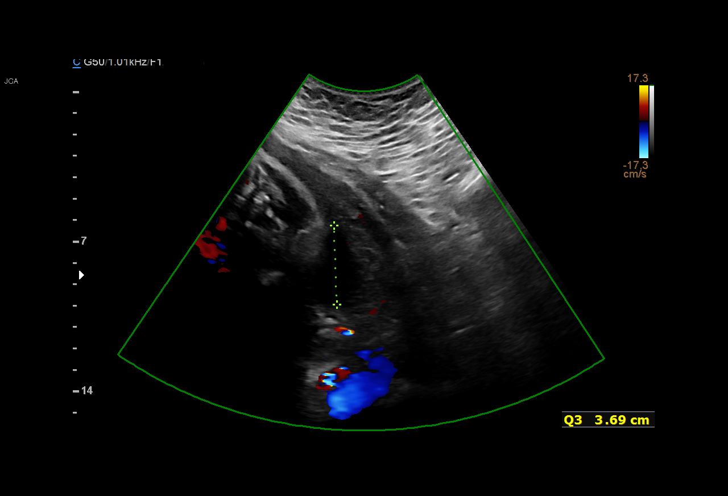
[im 20/41]
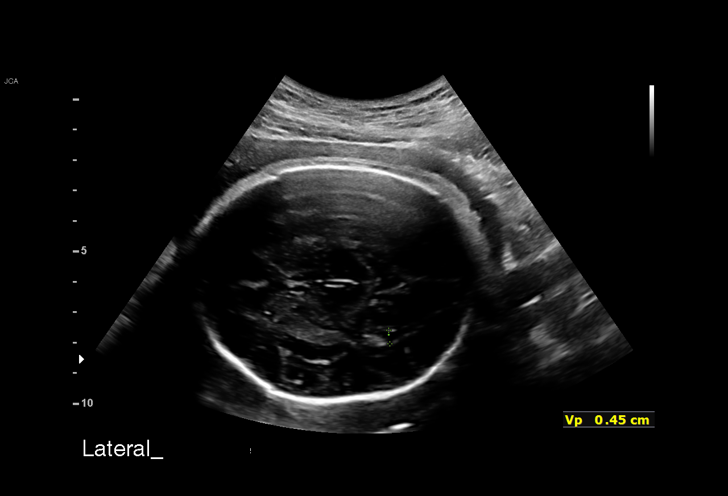
[im 23/41]
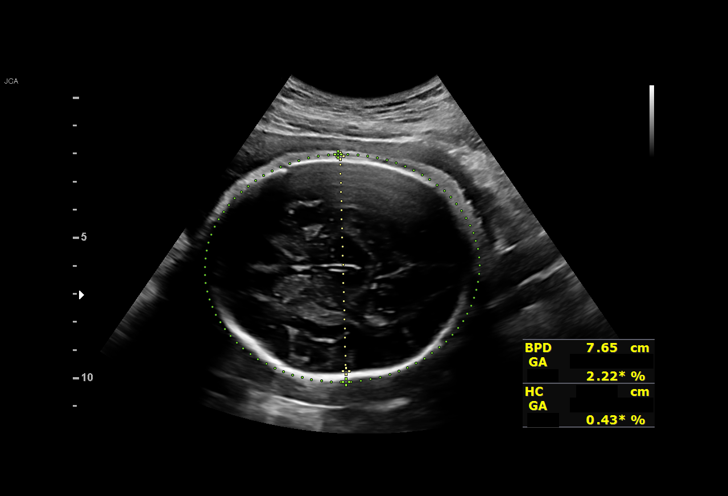
[im 26/41]
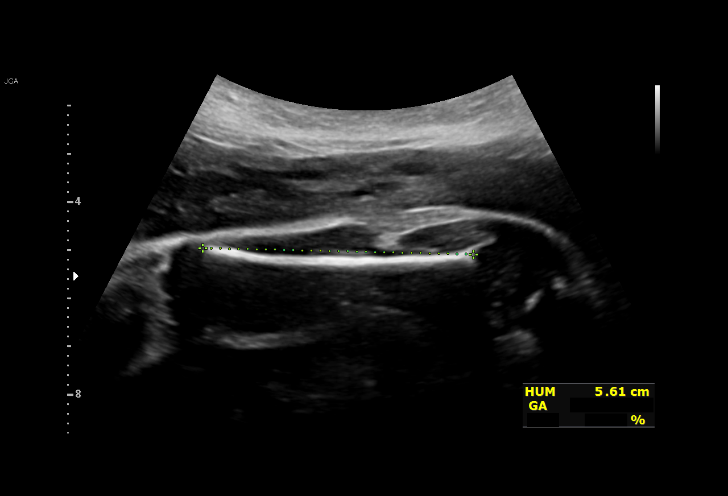
[im 29/41]
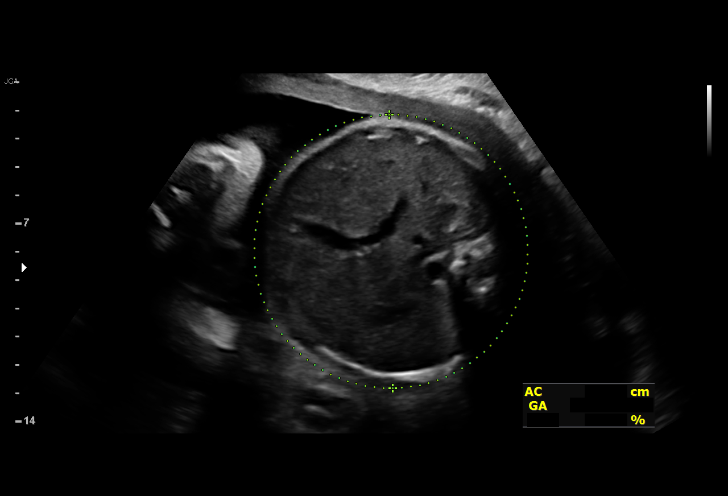
[im 32/41]
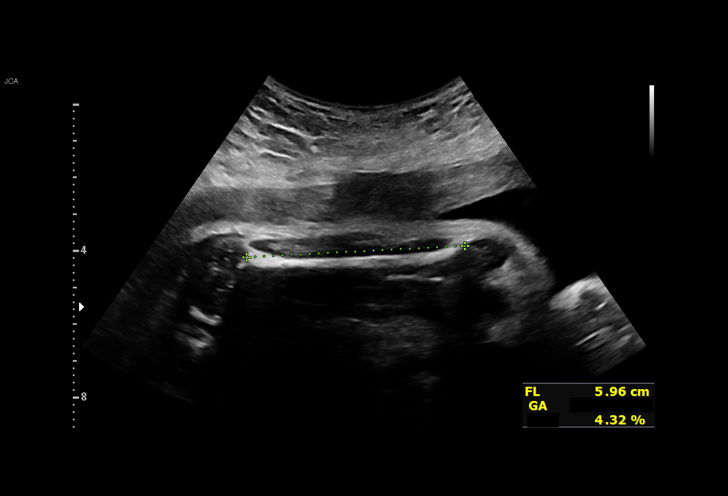
[im 35/41]
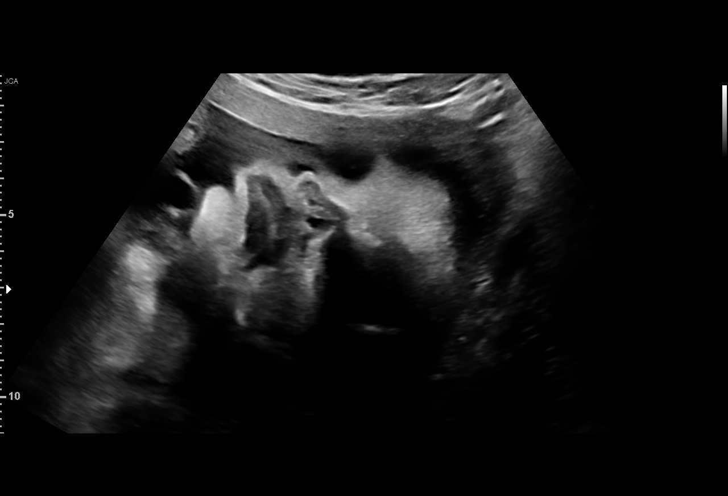
[im 38/41]
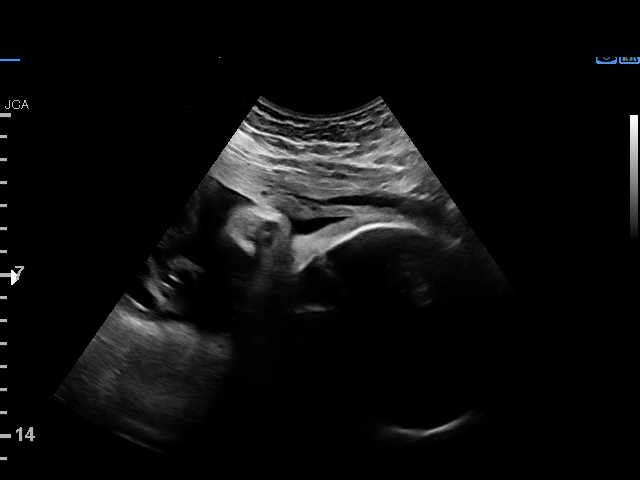
[im 41/41]
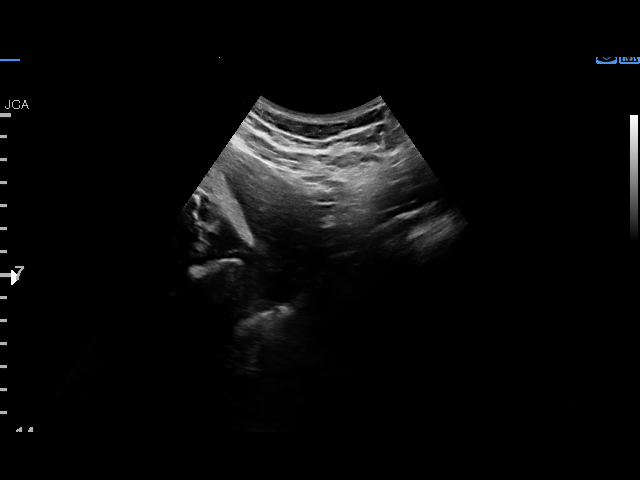

[14 of 28 positions shown; findings below may reference images not displayed]

INSTALACIJE

Indications

 Gestational diabetes in pregnancy, diet
 controlled
 Obesity complicating pregnancy, third
 trimester (BMI 37)
 Tobacco use complicating pregnancy, third
 trimester
 33 weeks gestation of pregnancy
 Encounter for other antenatal screening
 follow-up
Fetal Evaluation

 Num Of Fetuses:         1
 Fetal Heart Rate(bpm):  141
 Cardiac Activity:       Observed
 Presentation:           Cephalic
 Placenta:               Posterior
 P. Cord Insertion:      Visualized

 Amniotic Fluid
 AFI FV:      Within normal limits

 AFI Sum(cm)     %Tile       Largest Pocket(cm)
 20.33           77

 RUQ(cm)       RLQ(cm)       LUQ(cm)        LLQ(cm)

Biometry

 BPD:      77.3  mm     G. Age:  31w 0d        3.9  %    CI:        76.65   %    70 - 86
                                                         FL/HC:      21.2   %    19.9 -
 HC:      279.7  mm     G. Age:  30w 4d        < 1  %    HC/AC:      0.90        0.96 -
 AC:      309.5  mm     G. Age:  34w 6d         93  %    FL/BPD:     76.6   %    71 - 87
 FL:       59.2  mm     G. Age:  30w 6d        3.1  %    FL/AC:      19.1   %    20 - 24
 HUM:      55.7  mm     G. Age:  32w 3d         47  %

 LV:        4.5  mm

 Est. FW:    1977  gm    4 lb 10 oz      39  %
OB History

 Gravidity:    5         Term:   2        Prem:   0        SAB:   0
 TOP:          2       Ectopic:  0        Living: 2
Gestational Age

 LMP:           33w 0d        Date:  07/27/20                 EDD:   05/03/21
 U/S Today:     31w 6d                                        EDD:   05/11/21
 Best:          33w 0d     Det. By:  LMP  (07/27/20)          EDD:   05/03/21
Anatomy

 Cranium:               Appears normal         Aortic Arch:            Previously seen
 Cavum:                 Appears normal         Ductal Arch:            Previously seen
 Ventricles:            Appears normal         Diaphragm:              Appears normal
 Choroid Plexus:        Previously seen        Stomach:                Appears normal, left
                                                                       sided
 Cerebellum:            Previously seen        Abdomen:                Previously seen
 Posterior Fossa:       Previously seen        Abdominal Wall:         Previously seen
 Nuchal Fold:           Not applicable (>20    Cord Vessels:           Previously seen
                        wks GA)
 Face:                  Orbits and profile     Kidneys:                Appear normal
                        previously seen
 Lips:                  Appears normal         Bladder:                Appears normal
 Thoracic:              Appears normal         Spine:                  Previously seen
 Heart:                 Previously seen        Upper Extremities:      Previously seen
 RVOT:                  Previously seen        Lower Extremities:      Previously seen
 LVOT:                  Previously seen

 Other:  Heels and 5th digits visualized. Nasal bone visualized. Lenses
         visualized. Fetus appears to be a male.
Impression

 Gestational diabetes.  Patient reports her blood glucose
 levels are within normal range and diabetes is well controlled
 on diet.
 Fetal growth is appropriate for gestational age. Amniotic fluid
 is normal.

Recommendations

 -An appointment was made for her to return in 4 weeks for
 fetal growth assessment.
 -If patient requires oral hypoglycemics or insulin, weekly BPP
 is recommended.
                 Coffee, Julser

## 2022-05-25 IMAGING — DX DG FOOT COMPLETE 3+V*L*
3 series · 3 of 3 positions shown · non-contrast
Comparison: None.

CLINICAL DATA: Pain and swelling in the left foot for 3 weeks.

EXAM:
LEFT FOOT - COMPLETE 3+ VIEW

[foot ap]
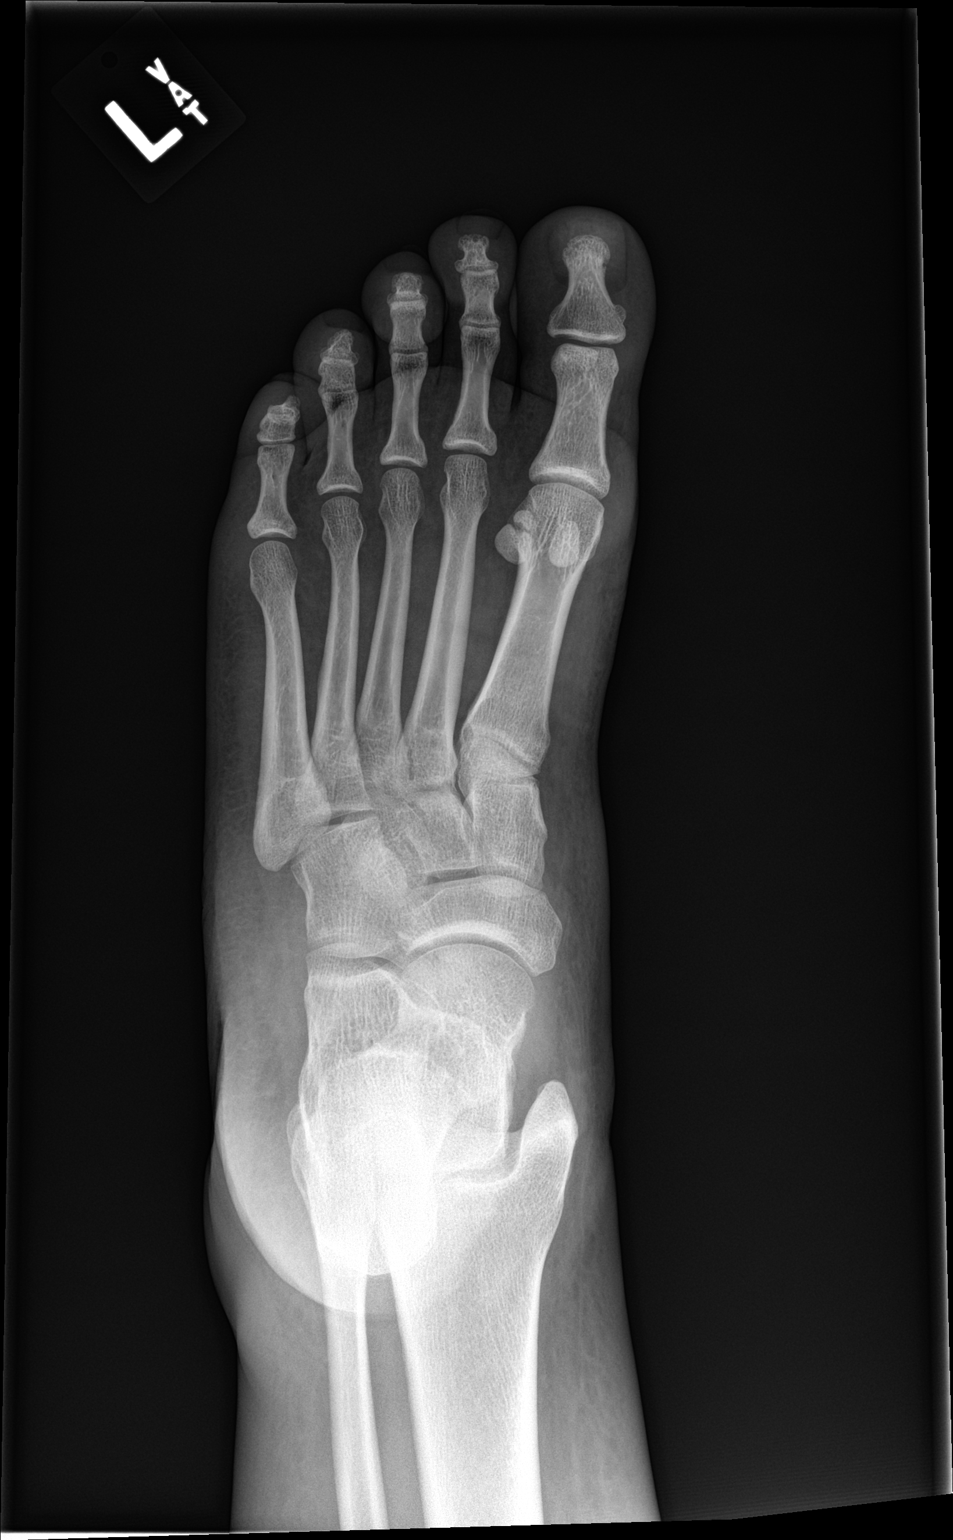

[foot obl]
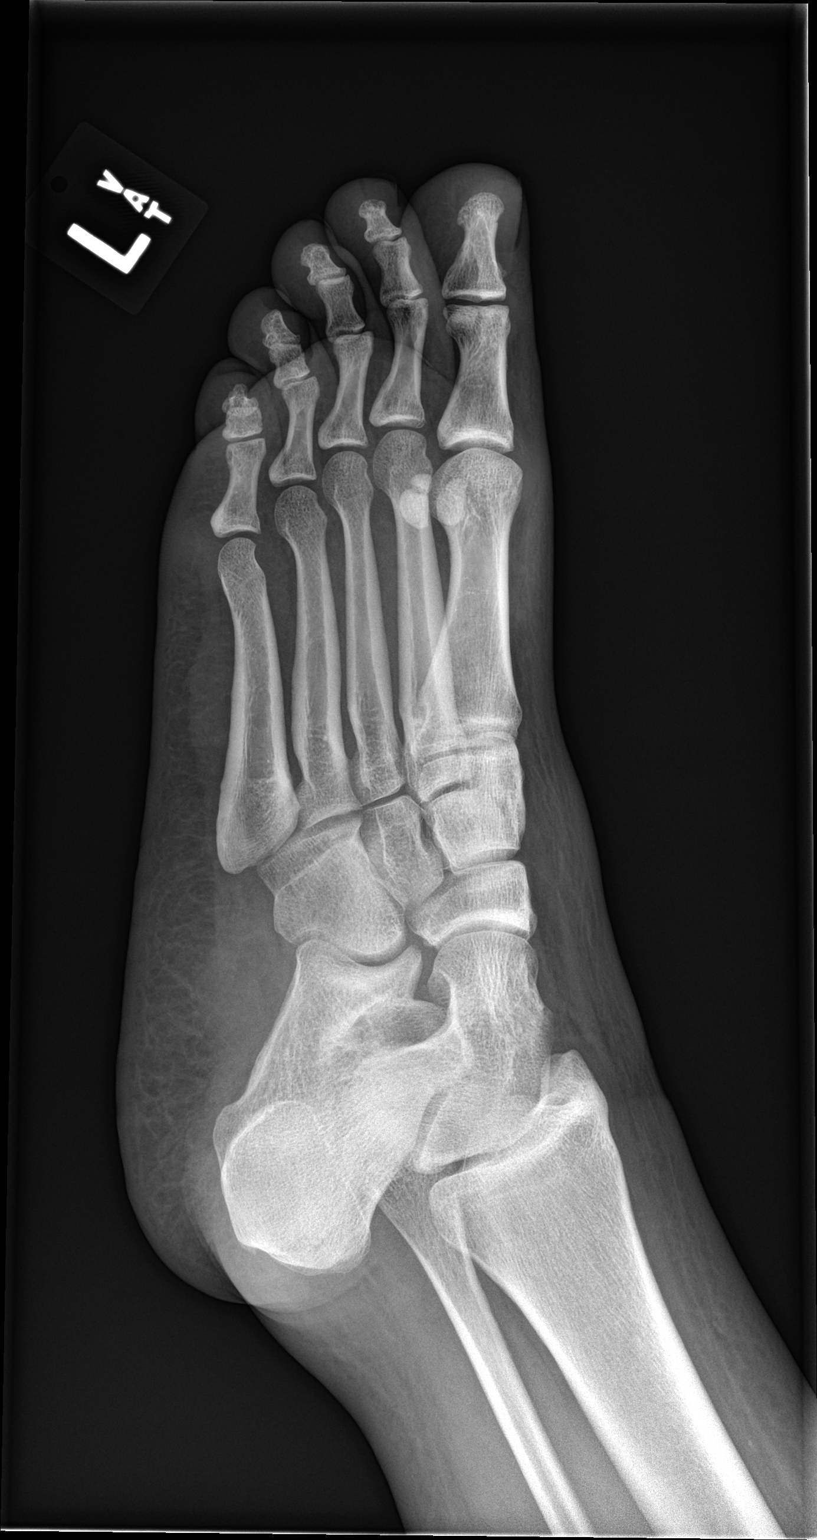

[foot lat]
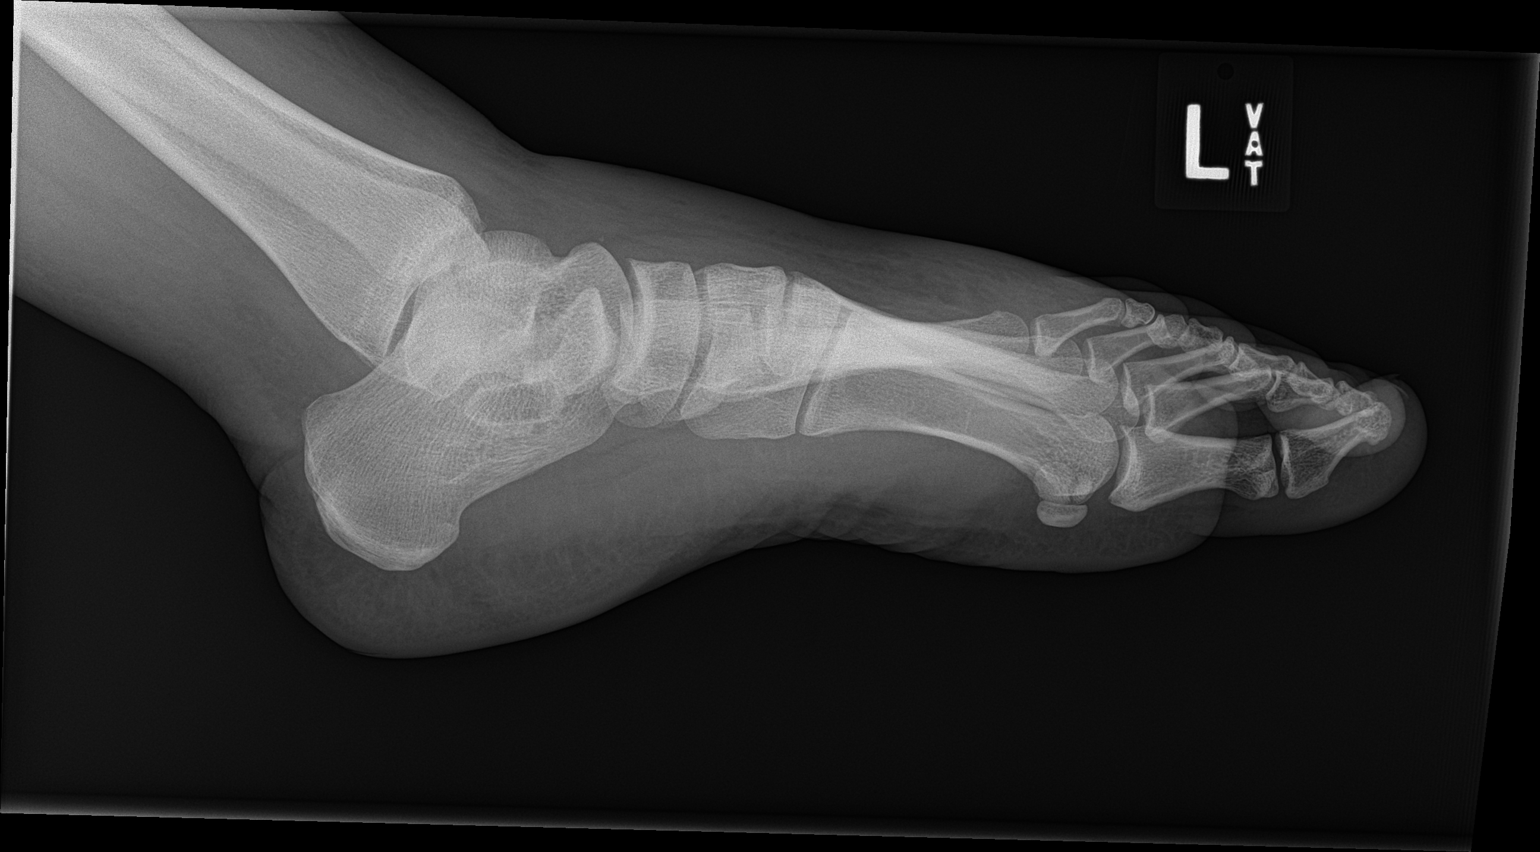

[3 of 3 positions shown; findings below may reference images not displayed]

FINDINGS: The joint spaces are maintained. No degenerative or erosive
findings. The bony structures are intact. No findings suspicious for
stress fracture or AVN.
IMPRESSION: Normal left foot radiographs.

## 2022-09-14 ENCOUNTER — Encounter (INDEPENDENT_AMBULATORY_CARE_PROVIDER_SITE_OTHER): Payer: Self-pay | Admitting: Primary Care

## 2022-09-14 ENCOUNTER — Ambulatory Visit (INDEPENDENT_AMBULATORY_CARE_PROVIDER_SITE_OTHER): Payer: 59 | Admitting: Primary Care

## 2022-09-14 VITALS — BP 116/81 | HR 86 | Resp 16 | Ht 64.0 in | Wt 252.8 lb

## 2022-09-14 DIAGNOSIS — Z6841 Body Mass Index (BMI) 40.0 and over, adult: Secondary | ICD-10-CM | POA: Diagnosis not present

## 2022-09-14 DIAGNOSIS — L709 Acne, unspecified: Secondary | ICD-10-CM | POA: Diagnosis not present

## 2022-09-14 DIAGNOSIS — Z7689 Persons encountering health services in other specified circumstances: Secondary | ICD-10-CM | POA: Diagnosis not present

## 2022-09-14 DIAGNOSIS — F32A Depression, unspecified: Secondary | ICD-10-CM

## 2022-09-14 DIAGNOSIS — Z8632 Personal history of gestational diabetes: Secondary | ICD-10-CM | POA: Diagnosis not present

## 2022-09-14 DIAGNOSIS — Z0000 Encounter for general adult medical examination without abnormal findings: Secondary | ICD-10-CM

## 2022-09-14 DIAGNOSIS — O24410 Gestational diabetes mellitus in pregnancy, diet controlled: Secondary | ICD-10-CM

## 2022-09-14 DIAGNOSIS — Z3009 Encounter for other general counseling and advice on contraception: Secondary | ICD-10-CM

## 2022-09-14 NOTE — Patient Instructions (Signed)
Calorie Counting for Weight Loss Calories are units of energy. Your body needs a certain number of calories from food to keep going throughout the day. When you eat or drink more calories than your body needs, your body stores the extra calories mostly as fat. When you eat or drink fewer calories than your body needs, your body burns fat to get the energy it needs. Calorie counting means keeping track of how many calories you eat and drink each day. Calorie counting can be helpful if you need to lose weight. If you eat fewer calories than your body needs, you should lose weight. Ask your health care provider what a healthy weight is for you. For calorie counting to work, you will need to eat the right number of calories each day to lose a healthy amount of weight per week. A dietitian can help you figure out how many calories you need in a day and will suggest ways to reach your calorie goal. A healthy amount of weight to lose each week is usually 1-2 lb (0.5-0.9 kg). This usually means that your daily calorie intake should be reduced by 500-750 calories. Eating 1,200-1,500 calories a day can help most women lose weight. Eating 1,500-1,800 calories a day can help most men lose weight. What do I need to know about calorie counting? Work with your health care provider or dietitian to determine how many calories you should get each day. To meet your daily calorie goal, you will need to: Find out how many calories are in each food that you would like to eat. Try to do this before you eat. Decide how much of the food you plan to eat. Keep a food log. Do this by writing down what you ate and how many calories it had. To successfully lose weight, it is important to balance calorie counting with a healthy lifestyle that includes regular activity. Where do I find calorie information?  The number of calories in a food can be found on a Nutrition Facts label. If a food does not have a Nutrition Facts label, try  to look up the calories online or ask your dietitian for help. Remember that calories are listed per serving. If you choose to have more than one serving of a food, you will have to multiply the calories per serving by the number of servings you plan to eat. For example, the label on a package of bread might say that a serving size is 1 slice and that there are 90 calories in a serving. If you eat 1 slice, you will have eaten 90 calories. If you eat 2 slices, you will have eaten 180 calories. How do I keep a food log? After each time that you eat, record the following in your food log as soon as possible: What you ate. Be sure to include toppings, sauces, and other extras on the food. How much you ate. This can be measured in cups, ounces, or number of items. How many calories were in each food and drink. The total number of calories in the food you ate. Keep your food log near you, such as in a pocket-sized notebook or on an app or website on your mobile phone. Some programs will calculate calories for you and show you how many calories you have left to meet your daily goal. What are some portion-control tips? Know how many calories are in a serving. This will help you know how many servings you can have of a certain   food. Use a measuring cup to measure serving sizes. You could also try weighing out portions on a kitchen scale. With time, you will be able to estimate serving sizes for some foods. Take time to put servings of different foods on your favorite plates or in your favorite bowls and cups so you know what a serving looks like. Try not to eat straight from a food's packaging, such as from a bag or box. Eating straight from the package makes it hard to see how much you are eating and can lead to overeating. Put the amount you would like to eat in a cup or on a plate to make sure you are eating the right portion. Use smaller plates, glasses, and bowls for smaller portions and to prevent  overeating. Try not to multitask. For example, avoid watching TV or using your computer while eating. If it is time to eat, sit down at a table and enjoy your food. This will help you recognize when you are full. It will also help you be more mindful of what and how much you are eating. What are tips for following this plan? Reading food labels Check the calorie count compared with the serving size. The serving size may be smaller than what you are used to eating. Check the source of the calories. Try to choose foods that are high in protein, fiber, and vitamins, and low in saturated fat, trans fat, and sodium. Shopping Read nutrition labels while you shop. This will help you make healthy decisions about which foods to buy. Pay attention to nutrition labels for low-fat or fat-free foods. These foods sometimes have the same number of calories or more calories than the full-fat versions. They also often have added sugar, starch, or salt to make up for flavor that was removed with the fat. Make a grocery list of lower-calorie foods and stick to it. Cooking Try to cook your favorite foods in a healthier way. For example, try baking instead of frying. Use low-fat dairy products. Meal planning Use more fruits and vegetables. One-half of your plate should be fruits and vegetables. Include lean proteins, such as chicken, turkey, and fish. Lifestyle Each week, aim to do one of the following: 150 minutes of moderate exercise, such as walking. 75 minutes of vigorous exercise, such as running. General information Know how many calories are in the foods you eat most often. This will help you calculate calorie counts faster. Find a way of tracking calories that works for you. Get creative. Try different apps or programs if writing down calories does not work for you. What foods should I eat?  Eat nutritious foods. It is better to have a nutritious, high-calorie food, such as an avocado, than a food with  few nutrients, such as a bag of potato chips. Use your calories on foods and drinks that will fill you up and will not leave you hungry soon after eating. Examples of foods that fill you up are nuts and nut butters, vegetables, lean proteins, and high-fiber foods such as whole grains. High-fiber foods are foods with more than 5 g of fiber per serving. Pay attention to calories in drinks. Low-calorie drinks include water and unsweetened drinks. The items listed above may not be a complete list of foods and beverages you can eat. Contact a dietitian for more information. What foods should I limit? Limit foods or drinks that are not good sources of vitamins, minerals, or protein or that are high in unhealthy fats. These   include: Candy. Other sweets. Sodas, specialty coffee drinks, alcohol, and juice. The items listed above may not be a complete list of foods and beverages you should avoid. Contact a dietitian for more information. How do I count calories when eating out? Pay attention to portions. Often, portions are much larger when eating out. Try these tips to keep portions smaller: Consider sharing a meal instead of getting your own. If you get your own meal, eat only half of it. Before you start eating, ask for a container and put half of your meal into it. When available, consider ordering smaller portions from the menu instead of full portions. Pay attention to your food and drink choices. Knowing the way food is cooked and what is included with the meal can help you eat fewer calories. If calories are listed on the menu, choose the lower-calorie options. Choose dishes that include vegetables, fruits, whole grains, low-fat dairy products, and lean proteins. Choose items that are boiled, broiled, grilled, or steamed. Avoid items that are buttered, battered, fried, or served with cream sauce. Items labeled as crispy are usually fried, unless stated otherwise. Choose water, low-fat milk,  unsweetened iced tea, or other drinks without added sugar. If you want an alcoholic beverage, choose a lower-calorie option, such as a glass of wine or light beer. Ask for dressings, sauces, and syrups on the side. These are usually high in calories, so you should limit the amount you eat. If you want a salad, choose a garden salad and ask for grilled meats. Avoid extra toppings such as bacon, cheese, or fried items. Ask for the dressing on the side, or ask for olive oil and vinegar or lemon to use as dressing. Estimate how many servings of a food you are given. Knowing serving sizes will help you be aware of how much food you are eating at restaurants. Where to find more information Centers for Disease Control and Prevention: www.cdc.gov U.[place]. Department of Agriculture: myplate.gov Summary Calorie counting means keeping track of how many calories you eat and drink each day. If you eat fewer calories than your body needs, you should lose weight. A healthy amount of weight to lose per week is usually 1-2 lb (0.5-0.9 kg). This usually means reducing your daily calorie intake by 500-750 calories. The number of calories in a food can be found on a Nutrition Facts label. If a food does not have a Nutrition Facts label, try to look up the calories online or ask your dietitian for help. Use smaller plates, glasses, and bowls for smaller portions and to prevent overeating. Use your calories on foods and drinks that will fill you up and not leave you hungry shortly after a meal. This information is not intended to replace advice given to you by your health care provider. Make sure you discuss any questions you have with your health care provider. Document Revised: 01/15/2020 Document Reviewed: 01/15/2020 Elsevier Patient Education  2023 Elsevier Inc.  

## 2022-09-14 NOTE — Progress Notes (Signed)
New Patient Office Visit  Subjective    Patient ID: Susan Clements, female    DOB: [birth year]  Age: 31 y.o. MRN: [id number]  CC:  Chief Complaint  Patient presents with   New Patient (Initial Visit)   Obesity    Weight loss  Pt states this is part of her depression    Acne   Contraception    Pt states she has the IUD and she is wanting another form of BC    HPI [name] is a 31 year old morbid obese female presents to establish care. She voices concerns of poor self esteem due to obesity also, causing depression. She would like to change from IUD to oral contraception. Explained IUD needs to be removed by the obstriction who place it. States on [facility] pills acne was better but would like to see a dermatologist. Patient has No headache, No chest pain, No abdominal pain - No Nausea, No new weakness tingling or numbness, No Cough - shortness of breath    Outpatient Encounter Medications as of 09/14/2022  Medication Sig   acetaminophen (TYLENOL) 500 MG tablet Take 2 tablets (1,000 mg total) by mouth every 4 (four) hours as needed (for pain scale < 4). (Patient not taking: Reported on 09/14/2022)   albuterol (VENTOLIN HFA) 108 (90 Base) MCG/ACT inhaler Inhale 2 puffs into the lungs every 6 (six) hours as needed for wheezing or shortness of breath. (Patient not taking: Reported on 06/13/2021)   Blood Pressure Monitoring (BLOOD PRESSURE KIT) DEVI 1 kit by Does not apply route once a week.   coconut oil OIL Apply 1 application topically as needed. (Patient not taking: Reported on 09/14/2022)   Prenatal Vit-Fe Fumarate-FA (PREPLUS) 27-1 MG TABS Take 1 tablet by mouth daily.   No facility-administered encounter medications on file as of 09/14/2022.    Past Medical History:  Diagnosis Date   Asthma    Gestational diabetes     Past Surgical History:  Procedure Laterality Date   NO PAST SURGERIES      Family History  Problem Relation Age of Onset   Healthy Mother     Diabetes Father    Throat cancer Maternal Grandmother    Hypertension Maternal Grandmother    Diabetes Maternal Grandmother    Heart failure Maternal Grandmother    Hypertension Paternal Grandmother     Social History   Socioeconomic History   Marital status: Married    Spouse name: Not on file   Number of children: Not on file   Years of education: Not on file   Highest education level: Not on file  Occupational History   Not on file  Tobacco Use   Smoking status: Former    Packs/day: 0.25    Types: Cigars, Cigarettes    Quit date: 09/10/2020    Years since quitting: 2.0   Smokeless tobacco: Never   Tobacco comments:    on patch  Vaping Use   Vaping Use: Never used  Substance and Sexual Activity   Alcohol use: Not Currently    Comment: last drink was 1 month ago   Drug use: Not Currently   Sexual activity: Yes    Partners: Male    Birth control/protection: None  Other Topics Concern   Not on file  Social History Narrative   Not on file   Social Determinants of Health   Financial Resource Strain: Not on file  Food Insecurity: Not on file  Transportation Needs: Not on file  Physical Activity: Not on file  Stress: Not on file  Social Connections: Not on file  Intimate Partner Violence: Not on file    ROS Comprehensive ROS Pertinent positive and negative noted in HPI     Objective    BP 116/81   Pulse 86   Resp 16   Ht $R'5\' 4"'aa$  (1.626 m)   Wt 252 lb 12.8 oz (114.7 kg)   LMP  (LMP Unknown) Comment: IUD  SpO2 97%   BMI 43.39 kg/m  General Appearance: Well nourished, morbid obese female in no apparent distress. Eyes: PERRLA, EOMs, conjunctiva no swelling or erythema Sinuses: No Frontal/maxillary tenderness ENT/Mouth: Ext aud canals clear, TMs without erythema, bulging. Hearing normal.  Neck: Supple, thyroid normal.  Respiratory: Respiratory effort normal, BS equal bilaterally without rales, rhonchi, wheezing or stridor.  Cardio: RRR with no MRGs. Brisk  peripheral pulses without edema.  Abdomen: Soft, + BS.  Non tender, no guarding, rebound, hernias, masses. Lymphatics: Non tender without lymphadenopathy.  Musculoskeletal: Full ROM, 5/5 strength, normal gait.  Skin: Warm, dry without rashes, lesions, ecchymosis.  Neuro: Cranial nerves intact. Normal muscle tone, no cerebellar symptoms. Sensation intact.  Psych: Awake and oriented X 3, normal affect, Insight and Judgment appropriate.     Assessment & Plan:  Ily was seen today for new patient (initial visit), obesity, acne and contraception.  Diagnoses and all orders for this visit:  Encounter to establish care Establish care with PCP  Morbid obesity ([place]) -     Lipid panel  Acne, unspecified acne type -     Ambulatory referral to Dermatology  Depression, unspecified depression type Secondary to weight and unable to loss regardless of exercising and diet modification.  Encounter for counseling regarding contraception -     Ambulatory referral to Gynecology  Healthcare maintenance -     CBC -     Comprehensive metabolic panel -     Hemoglobin A1c -     Lipid panel  Diet controlled gestational diabetes mellitus (GDM) in third trimester -     Hemoglobin A1c awaiting results     Kerin Perna, NP

## 2022-09-15 LAB — COMPREHENSIVE METABOLIC PANEL
ALT: 19 IU/L (ref 0–32)
AST: 15 IU/L (ref 0–40)
Albumin/Globulin Ratio: 1.8 (ref 1.2–2.2)
Albumin: 4.2 g/dL (ref 3.9–4.9)
Alkaline Phosphatase: 49 IU/L (ref 44–121)
BUN/Creatinine Ratio: 25 — ABNORMAL HIGH (ref 9–23)
BUN: 15 mg/dL (ref 6–20)
Bilirubin Total: 0.4 mg/dL (ref 0.0–1.2)
CO2: 24 mmol/L (ref 20–29)
Calcium: 9.3 mg/dL (ref 8.7–10.2)
Chloride: 103 mmol/L (ref 96–106)
Creatinine, Ser: 0.6 mg/dL (ref 0.57–1.00)
Globulin, Total: 2.4 g/dL (ref 1.5–4.5)
Glucose: 91 mg/dL (ref 70–99)
Potassium: 4.6 mmol/L (ref 3.5–5.2)
Sodium: 140 mmol/L (ref 134–144)
Total Protein: 6.6 g/dL (ref 6.0–8.5)
eGFR: 122 mL/min/{1.73_m2} (ref >59)

## 2022-09-15 LAB — CBC
Hematocrit: 37.8 % (ref 34.0–46.6)
Hemoglobin: 12.8 g/dL (ref 11.1–15.9)
MCH: 31 pg (ref 26.6–33.0)
MCHC: 33.9 g/dL (ref 31.5–35.7)
MCV: 92 fL (ref 79–97)
Platelets: 353 10*3/uL (ref 150–450)
RBC: 4.13 x10E6/uL (ref 3.77–5.28)
RDW: 10.8 % — ABNORMAL LOW (ref 11.7–15.4)
WBC: 3.8 10*3/uL (ref 3.4–10.8)

## 2022-09-15 LAB — LIPID PANEL
Chol/HDL Ratio: 5.5 ratio — ABNORMAL HIGH (ref 0.0–4.4)
Cholesterol, Total: 257 mg/dL — ABNORMAL HIGH (ref 100–199)
HDL: 47 mg/dL (ref >39)
LDL Chol Calc (NIH): 197 mg/dL — ABNORMAL HIGH (ref 0–99)
Triglycerides: 78 mg/dL (ref 0–149)
VLDL Cholesterol Cal: 13 mg/dL (ref 5–40)

## 2022-09-15 LAB — HEMOGLOBIN A1C
Est. average glucose Bld gHb Est-mCnc: 103 mg/dL
Hgb A1c MFr Bld: 5.2 % (ref 4.8–5.6)

## 2022-09-15 LAB — TSH+FREE T4
Free T4: 1.14 ng/dL (ref 0.82–1.77)
TSH: 1.15 u[IU]/mL (ref 0.450–4.500)

## 2022-09-20 ENCOUNTER — Other Ambulatory Visit (INDEPENDENT_AMBULATORY_CARE_PROVIDER_SITE_OTHER): Payer: Self-pay | Admitting: Primary Care

## 2022-09-20 DIAGNOSIS — E782 Mixed hyperlipidemia: Secondary | ICD-10-CM

## 2022-09-21 ENCOUNTER — Encounter (HOSPITAL_COMMUNITY): Payer: Self-pay | Admitting: *Deleted

## 2022-09-21 ENCOUNTER — Ambulatory Visit (HOSPITAL_COMMUNITY)
Admission: EM | Admit: 2022-09-21 | Discharge: 2022-09-21 | Disposition: A | Discharge status: Home / Self Care | Payer: Medicaid Other | Attending: Family Medicine | Admitting: Family Medicine

## 2022-09-21 DIAGNOSIS — U071 COVID-19: Secondary | ICD-10-CM | POA: Diagnosis not present

## 2022-09-21 DIAGNOSIS — J4521 Mild intermittent asthma with (acute) exacerbation: Secondary | ICD-10-CM

## 2022-09-21 MED ORDER — ALBUTEROL SULFATE HFA 108 (90 BASE) MCG/ACT IN AERS: 2.0000 | INHALATION_SPRAY | RESPIRATORY_TRACT | 0 refills | Status: AC | PRN

## 2022-09-21 MED ORDER — NIRMATRELVIR/RITONAVIR (PAXLOVID)TABLET: ORAL_TABLET | ORAL | 0 refills | Status: DC

## 2022-09-21 MED ORDER — PREDNISONE 20 MG PO TABS: 40.0000 mg | ORAL_TABLET | Freq: Every day | ORAL | 0 refills | Status: AC

## 2022-09-21 NOTE — ED Provider Notes (Signed)
[facility]    CSN: [id number] Arrival date & time: 09/21/22  1637      History   Chief Complaint Chief Complaint  Patient presents with   Cough   Sore Throat    HPI Susan Clements is a 31 y.o. female.    Cough Sore Throat   Here for congestion and feeling feverish since October 3.  Yesterday she began aching a lot and began wheezing.  She does have a history of asthma but states she is out of her inhaler.  No vomiting; she did a home COVID test today and it was positive.  Past Medical History:  Diagnosis Date   Asthma    Gestational diabetes     Patient Active Problem List   Diagnosis Date Noted   Encounter for postpartum visit 06/13/2021   [redacted] weeks gestation of pregnancy 05/02/2021   Vaginal delivery 05/01/2021   IUD (intrauterine device) in place 05/01/2021   Shoulder dystocia, delivered 05/01/2021   Encounter for elective induction of labor 04/30/2021   BMI 45.0-49.9, adult ([place]) 04/05/2021   Gestational diabetes 02/20/2021   Heartburn during pregnancy, antepartum 12/31/2020   Anxiety 12/31/2020   Major depressive disorder, single episode, severe ([place])    Obesity during pregnancy 11/05/2020   Supervision of high risk pregnancy, antepartum 10/29/2020    Past Surgical History:  Procedure Laterality Date   NO PAST SURGERIES      OB History     Gravida  5   Para  3   Term  3   Preterm      AB  2   Living  3      SAB      IAB  2   Ectopic      Multiple  0   Live Births  3            Home Medications    Prior to Admission medications   Medication Sig Start Date End Date Taking? Authorizing Provider  nirmatrelvir/ritonavir EUA (PAXLOVID) 20 x 150 MG & 10 x 100MG TABS Patient GFR is 122. Take nirmatrelvir (150 mg) two tablets twice daily for 5 days and ritonavir (100 mg) one tablet twice daily for 5 days. 09/21/22  Yes Barrett Henle, MD  predniSONE (DELTASONE) 20 MG tablet Take 2 tablets (40 mg total) by mouth  daily with breakfast for 5 days. 09/21/22 09/26/22 Yes Selvin Yun, Gwenlyn Perking, MD  acetaminophen (TYLENOL) 500 MG tablet Take 2 tablets (1,000 mg total) by mouth every 4 (four) hours as needed (for pain scale < 4). Patient not taking: Reported on 09/14/2022 08/21/99   Arrie Senate, MD  albuterol (VENTOLIN HFA) 108 (90 Base) MCG/ACT inhaler Inhale 2 puffs into the lungs every 4 (four) hours as needed for wheezing or shortness of breath. 09/21/22   Barrett Henle, MD  Blood Pressure Monitoring (BLOOD PRESSURE KIT) DEVI 1 kit by Does not apply route once a week. 04/11/21   Shelly Bombard, MD  coconut oil OIL Apply 1 application topically as needed. Patient not taking: Reported on 09/14/2022 09/09/29   Arrie Senate, MD  Prenatal Vit-Fe Fumarate-FA (PREPLUS) 27-1 MG TABS Take 1 tablet by mouth daily. 10/29/20   Woodroe Mode, MD    Family History Family History  Problem Relation Age of Onset   Healthy Mother    Diabetes Father    Throat cancer Maternal Grandmother    Hypertension Maternal Grandmother    Diabetes Maternal Grandmother  Heart failure Maternal Grandmother    Hypertension Paternal Grandmother     Social History Social History   Tobacco Use   Smoking status: Former    Packs/day: 0.25    Types: Cigars, Cigarettes    Quit date: 09/10/2020    Years since quitting: 2.0   Smokeless tobacco: Never   Tobacco comments:    on patch  Vaping Use   Vaping Use: Never used  Substance Use Topics   Alcohol use: Not Currently    Comment: last drink was 1 month ago   Drug use: Not Currently     Allergies   Nsaids   Review of Systems Review of Systems  Respiratory:  Positive for cough.      Physical Exam Triage Vital Signs ED Triage Vitals  Enc Vitals Group     BP 09/21/22 1655 107/76     Pulse Rate 09/21/22 1655 88     Resp 09/21/22 1655 18     Temp 09/21/22 1655 99.7 F (37.6 C)     Temp Source 09/21/22 1655 Oral     SpO2 09/21/22 1655 96 %     Weight  --      Height --      Head Circumference --      Peak Flow --      Pain Score 09/21/22 1653 0     Pain Loc --      Pain Edu? --      Excl. in [place]? --    No data found.  Updated Vital Signs BP 107/76 (BP Location: Right Arm)   Pulse 88   Temp 99.7 F (37.6 C) (Oral)   Resp 18   LMP  (LMP Unknown) Comment: IUD  SpO2 96%   Visual Acuity Right Eye Distance:   Left Eye Distance:   Bilateral Distance:    Right Eye Near:   Left Eye Near:    Bilateral Near:     Physical Exam Vitals reviewed.  Constitutional:      General: She is not in acute distress.    Appearance: She is not toxic-appearing.  HENT:     Right Ear: Tympanic membrane and ear canal normal.     Left Ear: Tympanic membrane and ear canal normal.     Nose: Nose normal.     Mouth/Throat:     Mouth: Mucous membranes are moist.     Pharynx: No oropharyngeal exudate or posterior oropharyngeal erythema.  Eyes:     Extraocular Movements: Extraocular movements intact.     Conjunctiva/sclera: Conjunctivae normal.     Pupils: Pupils are equal, round, and reactive to light.  Cardiovascular:     Rate and Rhythm: Normal rate and regular rhythm.     Heart sounds: No murmur heard. Pulmonary:     Effort: Pulmonary effort is normal. No respiratory distress.     Breath sounds: No stridor. No wheezing, rhonchi or rales.  Musculoskeletal:     Cervical back: Neck supple.  Lymphadenopathy:     Cervical: No cervical adenopathy.  Skin:    Capillary Refill: Capillary refill takes less than 2 seconds.     Coloration: Skin is not jaundiced or pale.  Neurological:     General: No focal deficit present.     Mental Status: She is alert and oriented to person, place, and time.  Psychiatric:        Behavior: Behavior normal.      UC Treatments / Results  Labs (all labs ordered are  listed, but only abnormal results are displayed) Labs Reviewed - No data to display  EKG   Radiology No results  found.  Procedures Procedures (including critical care time)  Medications Ordered in UC Medications - No data to display  Initial Impression / Assessment and Plan / UC Course  I have reviewed the triage vital signs and the nursing notes.  Pertinent labs & imaging results that were available during my care of the patient were reviewed by me and considered in my medical decision making (see chart for details).        Though she is currently not wheezing I do think she is having an asthma exacerbation along with her COVID infection.  I am going to send in prescription for albuterol, prednisone, and Paxlovid.  Her last EGFR was normal at 122 Final Clinical Impressions(s) / UC Diagnoses   Final diagnoses:  COVID-19  Mild intermittent asthma with acute exacerbation     Discharge Instructions      Albuterol inhaler--do 2 puffs every 4 hours as needed for shortness of breath or wheezing  Take nirmatrelvir 150 mg --2 tablets twice daily for 5 days, plus also ritonavir 100 mg--1 tablet twice daily for 5 days.  These are antiviral medicines, meant to keep you from getting worse with a COVID-19 infection  Take prednisone 20 mg--2 daily for 5 days      ED Prescriptions     Medication Sig Dispense Auth. Provider   albuterol (VENTOLIN HFA) 108 (90 Base) MCG/ACT inhaler Inhale 2 puffs into the lungs every 4 (four) hours as needed for wheezing or shortness of breath. 1 each Barrett Henle, MD   predniSONE (DELTASONE) 20 MG tablet Take 2 tablets (40 mg total) by mouth daily with breakfast for 5 days. 10 tablet Barrett Henle, MD   nirmatrelvir/ritonavir EUA (PAXLOVID) 20 x 150 MG & 10 x 100MG TABS Patient GFR is 122. Take nirmatrelvir (150 mg) two tablets twice daily for 5 days and ritonavir (100 mg) one tablet twice daily for 5 days. 30 tablet Hanae Waiters, Gwenlyn Perking, MD      PDMP not reviewed this encounter.   Barrett Henle, MD 09/21/22 [phone]

## 2022-09-21 NOTE — ED Triage Notes (Signed)
Pt states she woke up Tuesday morning with dry sore throat, cough and congestion. Yesterday she woke up with body pain and aches. She took a covid at home test and it was positive. She hasnt taken any meds except for her MDI.

## 2022-09-21 NOTE — Discharge Instructions (Addendum)
Albuterol inhaler--do 2 puffs every 4 hours as needed for shortness of breath or wheezing  Take nirmatrelvir 150 mg --2 tablets twice daily for 5 days, plus also ritonavir 100 mg--1 tablet twice daily for 5 days.  These are antiviral medicines, meant to keep you from getting worse with a COVID-19 infection  Take prednisone 20 mg--2 daily for 5 days

## 2022-11-23 ENCOUNTER — Encounter: Payer: Self-pay | Admitting: Obstetrics

## 2022-11-23 ENCOUNTER — Ambulatory Visit (INDEPENDENT_AMBULATORY_CARE_PROVIDER_SITE_OTHER): Payer: Self-pay

## 2022-11-23 ENCOUNTER — Ambulatory Visit (INDEPENDENT_AMBULATORY_CARE_PROVIDER_SITE_OTHER): Payer: 59 | Admitting: Obstetrics

## 2022-11-23 VITALS — BP 108/75 | HR 73 | Ht 64.0 in | Wt 256.0 lb

## 2022-11-23 DIAGNOSIS — Z30432 Encounter for removal of intrauterine contraceptive device: Secondary | ICD-10-CM

## 2022-11-23 DIAGNOSIS — Z3009 Encounter for other general counseling and advice on contraception: Secondary | ICD-10-CM

## 2022-11-23 DIAGNOSIS — Z30011 Encounter for initial prescription of contraceptive pills: Secondary | ICD-10-CM

## 2022-11-23 MED ORDER — NORETHIN ACE-ETH ESTRAD-FE 1-20 MG-MCG(24) PO TABS: 1.0000 | ORAL_TABLET | Freq: Every day | ORAL | 11 refills | Status: DC

## 2022-11-23 NOTE — Telephone Encounter (Signed)
Third attempt to return her call.    Per policy I have forwarded this to [facility].

## 2022-11-23 NOTE — Progress Notes (Signed)
    GYNECOLOGY OFFICE PROCEDURE NOTE  Susan Clements is a 31 y.o. [id number] here for Mirena IUD removal. No GYN concerns.  Last pap smear was on 11-05-2020 and was normal.  IUD Removal  Patient identified, informed consent performed, consent signed.  Patient was placed in the dorsal lithotomy position, normal external genitalia was noted.  A speculum was placed in the patient's vagina, normal discharge was noted, no lesions. The cervix was visualized, no lesions, no abnormal discharge.  The strings of the IUD were grasped and pulled using ring forceps. The IUD was removed in its entirety.  Patient tolerated the procedure well.    Patient will use OCP's for contraception/.  Routine preventative health maintenance measures emphasized.   Brock Bad, MD, FACOG Obstetrician & Gynecologist, [facility] for [facility], [facility], [place] 11/23/22

## 2022-11-23 NOTE — Progress Notes (Signed)
31 y.o GYN presents for IUD removal, Pt wants to start OCP.  Last PAP 11/05/2020

## 2022-11-23 NOTE — Telephone Encounter (Signed)
Second attempt to return call.   Voice mail left to call back.

## 2022-11-23 NOTE — Telephone Encounter (Signed)
Summary: Med Management   Pt stated she is unsure if something was supposed to be called in for her by PCP for high cholesterol a statin, and Rx for weight loss. Pt stated she discussed this with PCP at her last appointment but was unsure if something was going to be sent in.   Pt seeking clinical advice for med management.      Called pt - [id number]  Note from labs:   Your cholesterol is high, Increase risk of heart attack and/or stroke. To reduce your Cholesterol , Remember - more fruits and vegetables, more fish, and limit red meat and dairy products.  More soy, nuts, beans, barley, lentils, oats and plant sterol ester enriched margarine instead of butter.  I also encourage eliminating sugar and processed food.  New script sent for atorvastatin 40mg . A1c 5.2 indicates not being in diabetic.  Thyroid levels were normal  Written by , NP on 09/20/2022  5:06 PM EDT Seen by patient Zaharah Abdul-Matin on 09/20/2022  8:14 PM

## 2023-03-29 ENCOUNTER — Emergency Department (HOSPITAL_COMMUNITY): Payer: 59

## 2023-03-29 ENCOUNTER — Encounter (HOSPITAL_COMMUNITY): Payer: Self-pay

## 2023-03-29 ENCOUNTER — Observation Stay (HOSPITAL_COMMUNITY)
Admission: EM | Admit: 2023-03-29 | Discharge: 2023-03-31 | Disposition: A | Discharge status: Home / Self Care | Payer: 59 | Attending: General Surgery | Admitting: General Surgery

## 2023-03-29 ENCOUNTER — Other Ambulatory Visit: Payer: Self-pay

## 2023-03-29 DIAGNOSIS — J45909 Unspecified asthma, uncomplicated: Secondary | ICD-10-CM | POA: Insufficient documentation

## 2023-03-29 DIAGNOSIS — R1084 Generalized abdominal pain: Secondary | ICD-10-CM | POA: Diagnosis present

## 2023-03-29 DIAGNOSIS — K37 Unspecified appendicitis: Secondary | ICD-10-CM | POA: Diagnosis present

## 2023-03-29 DIAGNOSIS — K35209 Acute appendicitis with generalized peritonitis, without abscess, unspecified as to perforation: Principal | ICD-10-CM | POA: Insufficient documentation

## 2023-03-29 DIAGNOSIS — Z79899 Other long term (current) drug therapy: Secondary | ICD-10-CM | POA: Insufficient documentation

## 2023-03-29 DIAGNOSIS — K352 Acute appendicitis with generalized peritonitis, without perforation or abscess: Secondary | ICD-10-CM

## 2023-03-29 DIAGNOSIS — Z87891 Personal history of nicotine dependence: Secondary | ICD-10-CM | POA: Insufficient documentation

## 2023-03-29 LAB — URINALYSIS, ROUTINE W REFLEX MICROSCOPIC
Bacteria, UA: NONE SEEN
Bilirubin Urine: NEGATIVE
Glucose, UA: NEGATIVE mg/dL
Hgb urine dipstick: NEGATIVE
Ketones, ur: NEGATIVE mg/dL
Leukocytes,Ua: NEGATIVE
Nitrite: POSITIVE — AB
Protein, ur: NEGATIVE mg/dL
Specific Gravity, Urine: >1.046 — ABNORMAL HIGH (ref 1.005–1.030)
pH: 5 (ref 5.0–8.0)

## 2023-03-29 LAB — COMPREHENSIVE METABOLIC PANEL
ALT: 19 U/L (ref 0–44)
AST: 24 U/L (ref 15–41)
Albumin: 4.2 g/dL (ref 3.5–5.0)
Alkaline Phosphatase: 34 U/L — ABNORMAL LOW (ref 38–126)
Anion gap: 9 (ref 5–15)
BUN: 14 mg/dL (ref 6–20)
CO2: 24 mmol/L (ref 22–32)
Calcium: 10 mg/dL (ref 8.9–10.3)
Chloride: 100 mmol/L (ref 98–111)
Creatinine, Ser: 0.64 mg/dL (ref 0.44–1.00)
GFR, Estimated: >60 mL/min (ref >60)
Glucose, Bld: 98 mg/dL (ref 70–99)
Potassium: 3.5 mmol/L (ref 3.5–5.1)
Sodium: 133 mmol/L — ABNORMAL LOW (ref 135–145)
Total Bilirubin: 0.7 mg/dL (ref 0.3–1.2)
Total Protein: 7.9 g/dL (ref 6.5–8.1)

## 2023-03-29 LAB — CBC
HCT: 38.3 % (ref 36.0–46.0)
Hemoglobin: 12.5 g/dL (ref 12.0–15.0)
MCH: 30.6 pg (ref 26.0–34.0)
MCHC: 32.6 g/dL (ref 30.0–36.0)
MCV: 93.6 fL (ref 80.0–100.0)
Platelets: 331 10*3/uL (ref 150–400)
RBC: 4.09 MIL/uL (ref 3.87–5.11)
RDW: 11.3 % — ABNORMAL LOW (ref 11.5–15.5)
WBC: 10.5 10*3/uL (ref 4.0–10.5)
nRBC: 0 % (ref 0.0–0.2)

## 2023-03-29 LAB — LIPASE, BLOOD: Lipase: 29 U/L (ref 11–51)

## 2023-03-29 LAB — I-STAT BETA HCG BLOOD, ED (MC, WL, AP ONLY): I-stat hCG, quantitative: <5 m[IU]/mL (ref <5)

## 2023-03-29 MED ORDER — LORAZEPAM 2 MG/ML IJ SOLN
0.5000 mg | Freq: Once | INTRAMUSCULAR | Status: AC
  Administered 2023-03-29: 0.5 mg via INTRAVENOUS
  Filled 2023-03-29: qty 1

## 2023-03-29 MED ORDER — ALBUTEROL SULFATE (2.5 MG/3ML) 0.083% IN NEBU: 2.5000 mg | INHALATION_SOLUTION | RESPIRATORY_TRACT | Status: DC | PRN

## 2023-03-29 MED ORDER — SODIUM CHLORIDE 0.9 % IV BOLUS
500.0000 mL | Freq: Once | INTRAVENOUS | Status: AC
  Administered 2023-03-29: 500 mL via INTRAVENOUS

## 2023-03-29 MED ORDER — IOHEXOL 300 MG/ML  SOLN
100.0000 mL | Freq: Once | INTRAMUSCULAR | Status: AC | PRN
  Administered 2023-03-29: 100 mL via INTRAVENOUS

## 2023-03-29 MED ORDER — HYDROMORPHONE HCL 1 MG/ML IJ SOLN
0.5000 mg | Freq: Once | INTRAMUSCULAR | Status: AC
  Administered 2023-03-29: 0.5 mg via INTRAVENOUS
  Filled 2023-03-29: qty 1

## 2023-03-29 MED ORDER — SODIUM CHLORIDE 0.9 % IV SOLN: INTRAVENOUS | Status: DC

## 2023-03-29 MED ORDER — SODIUM CHLORIDE 0.9 % IV SOLN
2.0000 g | INTRAVENOUS | Status: DC
  Administered 2023-03-30 – 2023-03-31 (×2): 2 g via INTRAVENOUS
  Filled 2023-03-29 (×2): qty 20

## 2023-03-29 MED ORDER — METRONIDAZOLE 500 MG/100ML IV SOLN
500.0000 mg | Freq: Two times a day (BID) | INTRAVENOUS | Status: DC
  Administered 2023-03-30 – 2023-03-31 (×3): 500 mg via INTRAVENOUS
  Filled 2023-03-29 (×3): qty 100

## 2023-03-29 MED ORDER — ONDANSETRON HCL 4 MG/2ML IJ SOLN
4.0000 mg | Freq: Four times a day (QID) | INTRAMUSCULAR | Status: DC | PRN
  Administered 2023-03-30: 4 mg via INTRAVENOUS
  Filled 2023-03-29: qty 2

## 2023-03-29 MED ORDER — ALBUTEROL SULFATE HFA 108 (90 BASE) MCG/ACT IN AERS: 2.0000 | INHALATION_SPRAY | RESPIRATORY_TRACT | Status: DC | PRN

## 2023-03-29 MED ORDER — PANTOPRAZOLE SODIUM 40 MG IV SOLR
40.0000 mg | Freq: Every day | INTRAVENOUS | Status: DC
  Administered 2023-03-30 (×2): 40 mg via INTRAVENOUS
  Filled 2023-03-29 (×2): qty 10

## 2023-03-29 MED ORDER — ONDANSETRON HCL 4 MG/2ML IJ SOLN
4.0000 mg | Freq: Once | INTRAMUSCULAR | Status: AC
  Administered 2023-03-29: 4 mg via INTRAVENOUS
  Filled 2023-03-29: qty 2

## 2023-03-29 MED ORDER — ONDANSETRON 4 MG PO TBDP: 4.0000 mg | ORAL_TABLET | Freq: Four times a day (QID) | ORAL | Status: DC | PRN

## 2023-03-29 MED ORDER — MORPHINE SULFATE (PF) 2 MG/ML IV SOLN: 1.0000 mg | INTRAVENOUS | Status: DC | PRN

## 2023-03-29 NOTE — H&P (Signed)
Susan Clements is an 32 y.o. female.   Chief Complaint: Abdominal pain HPI: The patient is a 32 year old black female who began having abdominal pain this afternoon.  The pain was mostly periumbilical.  The pain was associated with some nausea and vomiting.  She denies any fevers or chills.  She came to the emergency department where a CT scan showed possible early appendicitis.  She is otherwise in good health except for some asthma.  She has never had abdominal surgery before.  Past Medical History:  Diagnosis Date   Asthma    Gestational diabetes     Past Surgical History:  Procedure Laterality Date   NO PAST SURGERIES      Family History  Problem Relation Age of Onset   Healthy Mother    Diabetes Father    Throat cancer Maternal Grandmother    Hypertension Maternal Grandmother    Diabetes Maternal Grandmother    Heart failure Maternal Grandmother    Hypertension Paternal Grandmother    Social History:  reports that she quit smoking about 2 years ago. Her smoking use included cigars and cigarettes. She smoked an average of .25 packs per day. She uses smokeless tobacco. She reports current alcohol use. She reports that she does not currently use drugs.  Allergies:  Allergies  Allergen Reactions   Nsaids     (Not in a hospital admission)   Results for orders placed or performed during the hospital encounter of 03/29/23 (from the past 48 hour(s))  Lipase, blood     Status: None   Collection Time: 03/29/23  5:49 PM  Result Value Ref Range   Lipase 29 11 - 51 U/L    Comment: Performed at [facility], [address], [place], [place] [address]  Comprehensive metabolic panel     Status: Abnormal   Collection Time: 03/29/23  5:49 PM  Result Value Ref Range   Sodium 133 (L) 135 - 145 mmol/L   Potassium 3.5 3.5 - 5.1 mmol/L   Chloride 100 98 - 111 mmol/L   CO2 24 22 - 32 mmol/L   Glucose, Bld 98 70 - 99 mg/dL    Comment: Glucose reference range  applies only to samples taken after fasting for at least 8 hours.   BUN 14 6 - 20 mg/dL   Creatinine, Ser 6.04 0.44 - 1.00 mg/dL   Calcium 54.0 8.9 - 98.1 mg/dL   Total Protein 7.9 6.5 - 8.1 g/dL   Albumin 4.2 3.5 - 5.0 g/dL   AST 24 15 - 41 U/L   ALT 19 0 - 44 U/L   Alkaline Phosphatase 34 (L) 38 - 126 U/L   Total Bilirubin 0.7 0.3 - 1.2 mg/dL   GFR, Estimated >19 >14 mL/min    Comment: (NOTE) Calculated using the CKD-EPI Creatinine Equation (2021)    Anion gap 9 5 - 15    Comment: Performed at [facility], [address] [place], [place] [address]  CBC     Status: Abnormal   Collection Time: 03/29/23  5:49 PM  Result Value Ref Range   WBC 10.5 4.0 - 10.5 K/uL   RBC 4.09 3.87 - 5.11 MIL/uL   Hemoglobin 12.5 12.0 - 15.0 g/dL   HCT 62.1 30.8 - 65.7 %   MCV 93.6 80.0 - 100.0 fL   MCH 30.6 26.0 - 34.0 pg   MCHC 32.6 30.0 - 36.0 g/dL   RDW 84.6 (L) 96.2 - 95.2 %   Platelets  331 150 - 400 K/uL   nRBC 0.0 0.0 - 0.2 %    Comment: Performed at [facility], [address] [place], [place] [address]  I-Stat beta hCG blood, ED     Status: None   Collection Time: 03/29/23  5:57 PM  Result Value Ref Range   I-stat hCG, quantitative <5.0 <5 mIU/mL   Comment 3            Comment:   GEST. AGE      CONC.  (mIU/mL)   <=1 WEEK        5 - 50     2 WEEKS       50 - 500     3 WEEKS       100 - 10,000     4 WEEKS     1,000 - 30,000        FEMALE AND NON-PREGNANT FEMALE:     LESS THAN 5 mIU/mL    CT ABDOMEN PELVIS W CONTRAST  Result Date: 03/29/2023 CLINICAL DATA:  Severe abdominal pain since 2 hours ago, nausea, loose bowel movement today EXAM: CT ABDOMEN AND PELVIS WITH CONTRAST TECHNIQUE: Multidetector CT imaging of the abdomen and pelvis was performed using the standard protocol following bolus administration of intravenous contrast. RADIATION DOSE REDUCTION: This exam was performed according to the departmental dose-optimization program which  includes automated exposure control, adjustment of the mA and/or kV according to patient size and/or use of iterative reconstruction technique. CONTRAST:  OMNIPAQUE IOHEXOL 300 MG/ML  SOLN COMPARISON:  None Available. FINDINGS: Lower chest: No acute abnormality. Hepatobiliary: No solid liver abnormality is seen. No gallstones, gallbladder wall thickening, or biliary dilatation. Pancreas: Unremarkable. No pancreatic ductal dilatation or surrounding inflammatory changes. Spleen: Normal in size without significant abnormality. Adrenals/Urinary Tract: Adrenal glands are unremarkable. Kidneys are normal, without renal calculi, solid lesion, or hydronephrosis. Bladder is unremarkable. Stomach/Bowel: Stomach is within normal limits. Wall thickening and fat stranding of the appendix measuring up to 1.4 cm in caliber (series 2, image 63, series 4, image 53). Vascular/Lymphatic: No significant vascular findings are present. No enlarged abdominal or pelvic lymph nodes. Reproductive: No mass or other significant abnormality. Benign functional cyst of the right ovary measuring 3.2 x 2.1 cm, for which no further follow-up or characterization is required. No follow-up imaging recommended. Note: This recommendation does not apply to premenarchal patients and to those with increased risk (genetic, family history, elevated tumor markers or other high-risk factors) of ovarian cancer. Reference: JACR 2020 Feb; 17(2):248-254 Other: No abdominal wall hernia or abnormality. No ascites. Musculoskeletal: No acute or significant osseous findings. Chronic bilateral pars defects of L5. IMPRESSION: Wall thickening and fat stranding of the appendix measuring up to 1.4 cm in caliber, consistent with acute appendicitis. No evidence of complicating perforation or abscess. Electronically Signed   By: Jearld Lesch M.D.   On: 03/29/2023 20:44    Review of Systems  Constitutional: Negative.   HENT: Negative.    Eyes: Negative.    Respiratory: Negative.    Cardiovascular: Negative.   Gastrointestinal:  Positive for abdominal pain, nausea and vomiting.  Endocrine: Negative.   Genitourinary: Negative.   Musculoskeletal: Negative.   Skin: Negative.   Allergic/Immunologic: Negative.   Neurological: Negative.   Hematological: Negative.   Psychiatric/Behavioral: Negative.      Blood pressure 125/67, pulse (!) 103, temperature 98.2 F (36.8 C), temperature source Oral, resp. rate 16, height 5\' 4"  (1.626 m), weight 113.4 kg, SpO2 95 %,  unknown if currently breastfeeding. Physical Exam Vitals reviewed.  Constitutional:      General: She is not in acute distress.    Appearance: She is obese.  HENT:     Head: Normocephalic and atraumatic.     Right Ear: External ear normal.     Left Ear: External ear normal.     Nose: Nose normal.     Mouth/Throat:     Mouth: Mucous membranes are moist.     Pharynx: Oropharynx is clear.  Eyes:     Extraocular Movements: Extraocular movements intact.     Conjunctiva/sclera: Conjunctivae normal.     Pupils: Pupils are equal, round, and reactive to light.  Cardiovascular:     Rate and Rhythm: Normal rate and regular rhythm.     Pulses: Normal pulses.     Heart sounds: Normal heart sounds.  Pulmonary:     Effort: Pulmonary effort is normal. No respiratory distress.     Breath sounds: Normal breath sounds.  Abdominal:     General: Abdomen is flat.     Palpations: Abdomen is soft.     Comments: There is moderate tenderness RLQ. No guarding or peritonitis  Musculoskeletal:        General: No swelling or deformity. Normal range of motion.     Cervical back: Normal range of motion and neck supple. No tenderness.  Skin:    General: Skin is warm and dry.     Coloration: Skin is not jaundiced.  Neurological:     General: No focal deficit present.     Mental Status: She is alert and oriented to person, place, and time.  Psychiatric:        Mood and Affect: Mood normal.         Behavior: Behavior normal.      Assessment/Plan The patient appears to have an early acute appendicitis.  Because of the risk of sepsis and perforation I feel she would likely benefit from having her appendix removed.  She would also like to have this done.  I have discussed with her in detail the risks and benefits of the operation as well as some of the technical aspects including the risk of leak and she understands and wishes to proceed.  We will admit her to the hospital and start her on broad-spectrum antibiotic therapy.  We will discuss her situation with the primary team in the morning and plan for surgery tomorrow.  Chevis PrettyPaul Toth III, MD 03/29/2023, 10:36 PM

## 2023-03-29 NOTE — ED Triage Notes (Signed)
Pt reports severe all over abdominal pain that radiates to her pelvic area, onset 2 hrs ago, associated with nausea. No vomiting. LBM today and she reports it was watery.

## 2023-03-29 NOTE — ED Provider Notes (Signed)
[place] EMERGENCY DEPARTMENT AT [facility] Provider Note   CSN: [id number] Arrival date & time: 03/29/23  1731     History  Chief Complaint  Patient presents with   Abdominal Pain    Susan Clements is a 32 y.o. female.  Patient states that she started with abdominal pain and vomiting around 330 today.  Patient has no past medical history except for obesity.  The history is provided by the patient and medical records. No language interpreter was used.  Abdominal Pain Pain location:  Generalized Pain quality: aching   Pain radiates to:  Does not radiate Pain severity:  Moderate Onset quality:  Sudden Timing:  Constant Progression:  Worsening Chronicity:  New Context: not alcohol use   Relieved by:  Nothing Associated symptoms: no chest pain, no cough, no diarrhea, no fatigue and no hematuria        Home Medications Prior to Admission medications   Medication Sig Start Date End Date Taking? Authorizing Provider  acetaminophen (TYLENOL) 500 MG tablet Take 2 tablets (1,000 mg total) by mouth every 4 (four) hours as needed (for pain scale < 4). Patient not taking: Reported on 09/14/2022 05/02/21   Alric Seton, MD  albuterol (VENTOLIN HFA) 108 (90 Base) MCG/ACT inhaler Inhale 2 puffs into the lungs every 4 (four) hours as needed for wheezing or shortness of breath. 09/21/22   Zenia Resides, MD  Blood Pressure Monitoring (BLOOD PRESSURE KIT) DEVI 1 kit by Does not apply route once a week. 04/11/21   Brock Bad, MD  coconut oil OIL Apply 1 application topically as needed. Patient not taking: Reported on 09/14/2022 05/02/21   Alric Seton, MD  nirmatrelvir/ritonavir EUA (PAXLOVID) 20 x 150 MG & 10 x 100MG  TABS Patient GFR is 122. Take nirmatrelvir (150 mg) two tablets twice daily for 5 days and ritonavir (100 mg) one tablet twice daily for 5 days. Patient not taking: Reported on 11/23/2022 09/21/22   Zenia Resides, MD  Norethindrone  Acetate-Ethinyl Estrad-FE (LOESTRIN 24 FE) 1-20 MG-MCG(24) tablet Take 1 tablet by mouth daily. 11/23/22   Brock Bad, MD  Prenatal Vit-Fe Fumarate-FA (PREPLUS) 27-1 MG TABS Take 1 tablet by mouth daily. 10/29/20   Adam Phenix, MD      Allergies    Nsaids    Review of Systems   Review of Systems  Constitutional:  Negative for appetite change and fatigue.  HENT:  Negative for congestion, ear discharge and sinus pressure.   Eyes:  Negative for discharge.  Respiratory:  Negative for cough.   Cardiovascular:  Negative for chest pain.  Gastrointestinal:  Positive for abdominal pain. Negative for diarrhea.  Genitourinary:  Negative for frequency and hematuria.  Musculoskeletal:  Negative for back pain.  Skin:  Negative for rash.  Neurological:  Negative for seizures and headaches.  Psychiatric/Behavioral:  Negative for hallucinations.     Physical Exam Updated Vital Signs BP (!) 110/55   Pulse 88   Temp 98.8 F (37.1 C) (Oral)   Resp 19   Ht 5\' 4"  (1.626 m)   Wt 113.4 kg   SpO2 97%   BMI 42.91 kg/m  Physical Exam Vitals and nursing note reviewed.  Constitutional:      Appearance: She is well-developed.  HENT:     Head: Normocephalic.     Nose: Nose normal.  Eyes:     General: No scleral icterus.    Conjunctiva/sclera: Conjunctivae normal.  Neck:  Thyroid: No thyromegaly.  Cardiovascular:     Rate and Rhythm: Normal rate and regular rhythm.     Heart sounds: No murmur heard.    No friction rub. No gallop.  Pulmonary:     Breath sounds: No stridor. No wheezing or rales.  Chest:     Chest wall: No tenderness.  Abdominal:     General: There is no distension.     Tenderness: There is abdominal tenderness. There is no rebound.  Musculoskeletal:        General: Normal range of motion.     Cervical back: Neck supple.  Lymphadenopathy:     Cervical: No cervical adenopathy.  Skin:    Findings: No erythema or rash.  Neurological:     Mental Status: She is  alert and oriented to person, place, and time.     Motor: No abnormal muscle tone.     Coordination: Coordination normal.  Psychiatric:        Behavior: Behavior normal.     ED Results / Procedures / Treatments   Labs (all labs ordered are listed, but only abnormal results are displayed) Labs Reviewed  COMPREHENSIVE METABOLIC PANEL - Abnormal; Notable for the following components:      Result Value   Sodium 133 (*)    Alkaline Phosphatase 34 (*)    All other components within normal limits  CBC - Abnormal; Notable for the following components:   RDW 11.3 (*)    All other components within normal limits  LIPASE, BLOOD  URINALYSIS, ROUTINE W REFLEX MICROSCOPIC  I-STAT BETA HCG BLOOD, ED ([facility], [facility], [facility] ONLY)    EKG None  Radiology CT ABDOMEN PELVIS W CONTRAST  Result Date: 03/29/2023 CLINICAL DATA:  Severe abdominal pain since 2 hours ago, nausea, loose bowel movement today EXAM: CT ABDOMEN AND PELVIS WITH CONTRAST TECHNIQUE: Multidetector CT imaging of the abdomen and pelvis was performed using the standard protocol following bolus administration of intravenous contrast. RADIATION DOSE REDUCTION: This exam was performed according to the departmental dose-optimization program which includes automated exposure control, adjustment of the mA and/or kV according to patient size and/or use of iterative reconstruction technique. CONTRAST:  OMNIPAQUE IOHEXOL 300 MG/ML  SOLN COMPARISON:  None Available. FINDINGS: Lower chest: No acute abnormality. Hepatobiliary: No solid liver abnormality is seen. No gallstones, gallbladder wall thickening, or biliary dilatation. Pancreas: Unremarkable. No pancreatic ductal dilatation or surrounding inflammatory changes. Spleen: Normal in size without significant abnormality. Adrenals/Urinary Tract: Adrenal glands are unremarkable. Kidneys are normal, without renal calculi, solid lesion, or hydronephrosis. Bladder is unremarkable. Stomach/Bowel: Stomach is  within normal limits. Wall thickening and fat stranding of the appendix measuring up to 1.4 cm in caliber (series 2, image 63, series 4, image 53). Vascular/Lymphatic: No significant vascular findings are present. No enlarged abdominal or pelvic lymph nodes. Reproductive: No mass or other significant abnormality. Benign functional cyst of the right ovary measuring 3.2 x 2.1 cm, for which no further follow-up or characterization is required. No follow-up imaging recommended. Note: This recommendation does not apply to premenarchal patients and to those with increased risk (genetic, family history, elevated tumor markers or other high-risk factors) of ovarian cancer. Reference: JACR 2020 Feb; 17(2):248-254 Other: No abdominal wall hernia or abnormality. No ascites. Musculoskeletal: No acute or significant osseous findings. Chronic bilateral pars defects of L5. IMPRESSION: Wall thickening and fat stranding of the appendix measuring up to 1.4 cm in caliber, consistent with acute appendicitis. No evidence of complicating perforation or abscess. Electronically  Signed   By: Jearld LeschAlex D Bibbey M.D.   On: 03/29/2023 20:44    Procedures Procedures    Medications Ordered in ED Medications  HYDROmorphone (DILAUDID) injection 0.5 mg (0.5 mg Intravenous Given 03/29/23 1801)  LORazepam (ATIVAN) injection 0.5 mg (0.5 mg Intravenous Given 03/29/23 1800)  ondansetron (ZOFRAN) injection 4 mg (4 mg Intravenous Given 03/29/23 1800)  sodium chloride 0.9 % bolus 500 mL (0 mLs Intravenous Stopped 03/29/23 1832)  HYDROmorphone (DILAUDID) injection 0.5 mg (0.5 mg Intravenous Given 03/29/23 2002)  iohexol (OMNIPAQUE) 300 MG/ML solution 100 mL (100 mLs Intravenous Contrast Given 03/29/23 2021)    ED Course/ Medical Decision Making/ A&P                             Medical Decision Making Amount and/or Complexity of Data Reviewed Labs: ordered. Radiology: ordered.  Risk Prescription drug management. Decision regarding  hospitalization.  This patient presents to the ED for concern of abdominal pain, this involves an extensive number of treatment options, and is a complaint that carries with it a high risk of complications and morbidity.  The differential diagnosis includes appendicitis, ectopic pregnancy, gastroenteritis   Co morbidities that complicate the patient evaluation  Obesity   Additional history obtained:  Additional history obtained from patient External records from outside source obtained and reviewed including hot records   Lab Tests:  I Ordered, and personally interpreted labs.  The pertinent results include: CBC and chemistries, unremarkable   Imaging Studies ordered:  I ordered imaging studies including CT abdomen I independently visualized and interpreted imaging which showed appendicitis I agree with the radiologist interpretation   Cardiac Monitoring: / EKG:  The patient was maintained on a cardiac monitor.  I personally viewed and interpreted the cardiac monitored which showed an underlying rhythm of: Normal sinus rhythm   Consultations Obtained:  I requested consultation with the general surgery Dr. Carolynne Edouardoth,  and discussed lab and imaging findings as well as pertinent plan - they recommend: He will admit the patient and arrange for appendectomy the next day   Problem List / ED Course / Critical interventions / Medication management  Obesity and appendicitis I ordered medication including Dilaudid for pain Reevaluation of the patient after these medicines showed that the patient improved I have reviewed the patients home medicines and have made adjustments as needed   Social Determinants of Health:  None   Test / Admission - Considered:  None  Patient with acute appendicitis.  She will be admitted to the general surgeons and most likely have her appendix removed tomorrow        Final Clinical Impression(s) / ED Diagnoses Final diagnoses:  Acute  appendicitis with generalized peritonitis without gangrene, perforation, or abscess    Rx / DC Orders ED Discharge Orders     None         Bethann BerkshireZammit, Lakyra Tippins, MD 03/30/23 1114

## 2023-03-29 NOTE — ED Notes (Signed)
Pt refused to try to give urine sample informed the nurse

## 2023-03-30 ENCOUNTER — Observation Stay (HOSPITAL_COMMUNITY): Payer: 59 | Admitting: Anesthesiology

## 2023-03-30 ENCOUNTER — Observation Stay (HOSPITAL_BASED_OUTPATIENT_CLINIC_OR_DEPARTMENT_OTHER): Payer: 59 | Admitting: Anesthesiology

## 2023-03-30 ENCOUNTER — Other Ambulatory Visit: Payer: Self-pay

## 2023-03-30 ENCOUNTER — Encounter (HOSPITAL_COMMUNITY)
Admission: EM | Disposition: A | Discharge status: Home / Self Care | Payer: Self-pay | Source: Home / Self Care | Attending: Emergency Medicine

## 2023-03-30 ENCOUNTER — Encounter (HOSPITAL_COMMUNITY): Payer: Self-pay

## 2023-03-30 DIAGNOSIS — F418 Other specified anxiety disorders: Secondary | ICD-10-CM

## 2023-03-30 DIAGNOSIS — Z87891 Personal history of nicotine dependence: Secondary | ICD-10-CM | POA: Diagnosis not present

## 2023-03-30 DIAGNOSIS — T783XXA Angioneurotic edema, initial encounter: Secondary | ICD-10-CM

## 2023-03-30 DIAGNOSIS — K35209 Acute appendicitis with generalized peritonitis, without abscess, unspecified as to perforation: Secondary | ICD-10-CM | POA: Diagnosis not present

## 2023-03-30 DIAGNOSIS — J45909 Unspecified asthma, uncomplicated: Secondary | ICD-10-CM

## 2023-03-30 DIAGNOSIS — K3580 Unspecified acute appendicitis: Secondary | ICD-10-CM | POA: Diagnosis not present

## 2023-03-30 HISTORY — PX: LAPAROSCOPIC APPENDECTOMY: SHX408

## 2023-03-30 LAB — HIV ANTIBODY (ROUTINE TESTING W REFLEX): HIV Screen 4th Generation wRfx: NONREACTIVE

## 2023-03-30 SURGERY — APPENDECTOMY, LAPAROSCOPIC
Anesthesia: General

## 2023-03-30 MED ORDER — TRAMADOL HCL 50 MG PO TABS: 50.0000 mg | ORAL_TABLET | Freq: Four times a day (QID) | ORAL | Status: DC | PRN

## 2023-03-30 MED ORDER — BUPIVACAINE-EPINEPHRINE 0.25% -1:200000 IJ SOLN
INTRAMUSCULAR | Status: DC | PRN
  Administered 2023-03-30: 50 mL

## 2023-03-30 MED ORDER — METHYLPREDNISOLONE SODIUM SUCC 40 MG IJ SOLR
40.0000 mg | Freq: Two times a day (BID) | INTRAMUSCULAR | Status: DC
  Administered 2023-03-31: 40 mg via INTRAVENOUS
  Filled 2023-03-30: qty 1

## 2023-03-30 MED ORDER — CALCIUM POLYCARBOPHIL 625 MG PO TABS
625.0000 mg | ORAL_TABLET | Freq: Two times a day (BID) | ORAL | Status: DC
  Administered 2023-03-30 – 2023-03-31 (×2): 625 mg via ORAL
  Filled 2023-03-30 (×2): qty 1

## 2023-03-30 MED ORDER — MAGIC MOUTHWASH: 15.0000 mL | Freq: Four times a day (QID) | ORAL | Status: DC | PRN

## 2023-03-30 MED ORDER — FENTANYL CITRATE PF 50 MCG/ML IJ SOSY
PREFILLED_SYRINGE | INTRAMUSCULAR | Status: AC
  Filled 2023-03-30: qty 2

## 2023-03-30 MED ORDER — ACETAMINOPHEN 500 MG PO TABS
1000.0000 mg | ORAL_TABLET | Freq: Four times a day (QID) | ORAL | Status: DC
  Administered 2023-03-30 – 2023-03-31 (×3): 1000 mg via ORAL
  Filled 2023-03-30 (×3): qty 2

## 2023-03-30 MED ORDER — SCOPOLAMINE 1 MG/3DAYS TD PT72
1.0000 | MEDICATED_PATCH | Freq: Once | TRANSDERMAL | Status: DC
  Administered 2023-03-30: 1.5 mg via TRANSDERMAL
  Filled 2023-03-30: qty 1

## 2023-03-30 MED ORDER — SODIUM CHLORIDE 0.9 % IV SOLN: 250.0000 mL | INTRAVENOUS | Status: DC | PRN

## 2023-03-30 MED ORDER — EPINEPHRINE 0.3 MG/0.3ML IJ SOAJ
0.3000 mg | Freq: Once | INTRAMUSCULAR | Status: AC
  Administered 2023-03-30: 0.3 mg via INTRAMUSCULAR
  Filled 2023-03-30: qty 0.3

## 2023-03-30 MED ORDER — CHLORHEXIDINE GLUCONATE CLOTH 2 % EX PADS
6.0000 | MEDICATED_PAD | Freq: Once | CUTANEOUS | Status: AC
  Administered 2023-03-30: 6 via TOPICAL

## 2023-03-30 MED ORDER — LIDOCAINE HCL (CARDIAC) PF 100 MG/5ML IV SOSY
PREFILLED_SYRINGE | INTRAVENOUS | Status: DC | PRN
  Administered 2023-03-30: 100 mg via INTRAVENOUS

## 2023-03-30 MED ORDER — FENTANYL CITRATE PF 50 MCG/ML IJ SOSY
25.0000 ug | PREFILLED_SYRINGE | INTRAMUSCULAR | Status: DC | PRN
  Administered 2023-03-30: 50 ug via INTRAVENOUS

## 2023-03-30 MED ORDER — FAMOTIDINE IN NACL 20-0.9 MG/50ML-% IV SOLN: 20.0000 mg | Freq: Once | INTRAVENOUS | Status: DC

## 2023-03-30 MED ORDER — ALUM & MAG HYDROXIDE-SIMETH 200-200-20 MG/5ML PO SUSP: 30.0000 mL | Freq: Four times a day (QID) | ORAL | Status: DC | PRN

## 2023-03-30 MED ORDER — BISACODYL 10 MG RE SUPP: 10.0000 mg | Freq: Two times a day (BID) | RECTAL | Status: DC | PRN

## 2023-03-30 MED ORDER — STERILE WATER FOR IRRIGATION IR SOLN
Status: DC | PRN
  Administered 2023-03-30: 1000 mL

## 2023-03-30 MED ORDER — ADULT MULTIVITAMIN W/MINERALS CH
1.0000 | ORAL_TABLET | Freq: Every day | ORAL | Status: DC
  Administered 2023-03-30 – 2023-03-31 (×2): 1 via ORAL
  Filled 2023-03-30 (×2): qty 1

## 2023-03-30 MED ORDER — KETAMINE HCL 50 MG/5ML IJ SOSY
PREFILLED_SYRINGE | INTRAMUSCULAR | Status: AC
  Filled 2023-03-30: qty 5

## 2023-03-30 MED ORDER — DIPHENHYDRAMINE HCL 50 MG/ML IJ SOLN
INTRAMUSCULAR | Status: AC
  Administered 2023-03-30: 25 mg via INTRAVENOUS
  Filled 2023-03-30: qty 1

## 2023-03-30 MED ORDER — METHYLPREDNISOLONE SODIUM SUCC 125 MG IJ SOLR
INTRAMUSCULAR | Status: AC
  Administered 2023-03-30: 125 mg via INTRAVENOUS
  Filled 2023-03-30: qty 2

## 2023-03-30 MED ORDER — ONDANSETRON HCL 4 MG/2ML IJ SOLN
INTRAMUSCULAR | Status: DC | PRN
  Administered 2023-03-30: 4 mg via INTRAVENOUS

## 2023-03-30 MED ORDER — GABAPENTIN 100 MG PO CAPS
300.0000 mg | ORAL_CAPSULE | ORAL | Status: AC
  Administered 2023-03-30: 300 mg via ORAL
  Filled 2023-03-30: qty 3

## 2023-03-30 MED ORDER — FAMOTIDINE IN NACL 20-0.9 MG/50ML-% IV SOLN: 20.0000 mg | Freq: Two times a day (BID) | INTRAVENOUS | Status: DC

## 2023-03-30 MED ORDER — TRAMADOL HCL 50 MG PO TABS: 50.0000 mg | ORAL_TABLET | Freq: Four times a day (QID) | ORAL | 0 refills | Status: AC | PRN

## 2023-03-30 MED ORDER — LACTATED RINGERS IV SOLN: INTRAVENOUS | Status: DC

## 2023-03-30 MED ORDER — SODIUM CHLORIDE 0.9% FLUSH
3.0000 mL | Freq: Two times a day (BID) | INTRAVENOUS | Status: DC
  Administered 2023-03-30 – 2023-03-31 (×2): 3 mL via INTRAVENOUS

## 2023-03-30 MED ORDER — EPINEPHRINE PF 1 MG/ML IJ SOLN
1.0000 mg | Freq: Once | INTRAMUSCULAR | Status: DC
  Filled 2023-03-30: qty 1

## 2023-03-30 MED ORDER — SODIUM CHLORIDE 0.9% FLUSH: 3.0000 mL | INTRAVENOUS | Status: DC | PRN

## 2023-03-30 MED ORDER — FAMOTIDINE IN NACL 20-0.9 MG/50ML-% IV SOLN
20.0000 mg | Freq: Two times a day (BID) | INTRAVENOUS | Status: DC
  Administered 2023-03-31: 20 mg via INTRAVENOUS
  Filled 2023-03-30: qty 50

## 2023-03-30 MED ORDER — SUCCINYLCHOLINE CHLORIDE 200 MG/10ML IV SOSY
PREFILLED_SYRINGE | INTRAVENOUS | Status: DC | PRN
  Administered 2023-03-30: 140 mg via INTRAVENOUS

## 2023-03-30 MED ORDER — BUPIVACAINE LIPOSOME 1.3 % IJ SUSP
INTRAMUSCULAR | Status: AC
  Filled 2023-03-30: qty 20

## 2023-03-30 MED ORDER — KETOROLAC TROMETHAMINE 30 MG/ML IJ SOLN
INTRAMUSCULAR | Status: AC
  Filled 2023-03-30: qty 1

## 2023-03-30 MED ORDER — KETAMINE HCL 10 MG/ML IJ SOLN
INTRAMUSCULAR | Status: DC | PRN
  Administered 2023-03-30: 30 mg via INTRAVENOUS

## 2023-03-30 MED ORDER — MIDAZOLAM HCL 2 MG/2ML IJ SOLN
INTRAMUSCULAR | Status: AC
  Filled 2023-03-30: qty 2

## 2023-03-30 MED ORDER — DIPHENHYDRAMINE HCL 50 MG/ML IJ SOLN
25.0000 mg | Freq: Once | INTRAMUSCULAR | Status: AC
  Administered 2023-03-30: 25 mg via INTRAVENOUS

## 2023-03-30 MED ORDER — HYDROMORPHONE HCL 1 MG/ML IJ SOLN: 0.5000 mg | INTRAMUSCULAR | Status: DC | PRN

## 2023-03-30 MED ORDER — METHOCARBAMOL 500 MG PO TABS: 1000.0000 mg | ORAL_TABLET | Freq: Four times a day (QID) | ORAL | Status: DC | PRN

## 2023-03-30 MED ORDER — SUGAMMADEX SODIUM 200 MG/2ML IV SOLN
INTRAVENOUS | Status: DC | PRN
  Administered 2023-03-30: 200 mg via INTRAVENOUS

## 2023-03-30 MED ORDER — VITAMIN C 500 MG PO TABS
500.0000 mg | ORAL_TABLET | Freq: Two times a day (BID) | ORAL | Status: DC
  Administered 2023-03-30 – 2023-03-31 (×2): 500 mg via ORAL
  Filled 2023-03-30 (×2): qty 1

## 2023-03-30 MED ORDER — BUPIVACAINE LIPOSOME 1.3 % IJ SUSP: 20.0000 mL | Freq: Once | INTRAMUSCULAR | Status: DC

## 2023-03-30 MED ORDER — 0.9 % SODIUM CHLORIDE (POUR BTL) OPTIME
TOPICAL | Status: DC | PRN
  Administered 2023-03-30: 1000 mL

## 2023-03-30 MED ORDER — PHENOL 1.4 % MT LIQD: 2.0000 | OROMUCOSAL | Status: DC | PRN

## 2023-03-30 MED ORDER — LACTATED RINGERS IV SOLN: INTRAVENOUS | Status: DC | PRN

## 2023-03-30 MED ORDER — PROMETHAZINE HCL 25 MG/ML IJ SOLN: 6.2500 mg | INTRAMUSCULAR | Status: DC | PRN

## 2023-03-30 MED ORDER — ACETAMINOPHEN 500 MG PO TABS
1000.0000 mg | ORAL_TABLET | ORAL | Status: AC
  Administered 2023-03-30: 1000 mg via ORAL
  Filled 2023-03-30: qty 2

## 2023-03-30 MED ORDER — DIPHENHYDRAMINE HCL 50 MG/ML IJ SOLN: 50.0000 mg | Freq: Four times a day (QID) | INTRAMUSCULAR | Status: DC | PRN

## 2023-03-30 MED ORDER — METOPROLOL TARTRATE 5 MG/5ML IV SOLN: 5.0000 mg | Freq: Four times a day (QID) | INTRAVENOUS | Status: DC | PRN

## 2023-03-30 MED ORDER — SODIUM CHLORIDE 0.9 % IV SOLN
40.0000 mg | Freq: Once | INTRAVENOUS | Status: AC
  Administered 2023-03-30: 40 mg via INTRAVENOUS
  Filled 2023-03-30: qty 4

## 2023-03-30 MED ORDER — DIPHENHYDRAMINE HCL 50 MG/ML IJ SOLN: 25.0000 mg | INTRAMUSCULAR | Status: AC

## 2023-03-30 MED ORDER — MENTHOL 3 MG MT LOZG: 1.0000 | LOZENGE | OROMUCOSAL | Status: DC | PRN

## 2023-03-30 MED ORDER — ROCURONIUM BROMIDE 100 MG/10ML IV SOLN
INTRAVENOUS | Status: DC | PRN
  Administered 2023-03-30: 40 mg via INTRAVENOUS

## 2023-03-30 MED ORDER — METHOCARBAMOL 1000 MG/10ML IJ SOLN: 1000.0000 mg | Freq: Four times a day (QID) | INTRAVENOUS | Status: DC | PRN

## 2023-03-30 MED ORDER — BUPIVACAINE-EPINEPHRINE 0.25% -1:200000 IJ SOLN
INTRAMUSCULAR | Status: AC
  Filled 2023-03-30: qty 1

## 2023-03-30 MED ORDER — FENTANYL CITRATE (PF) 250 MCG/5ML IJ SOLN
INTRAMUSCULAR | Status: AC
  Filled 2023-03-30: qty 5

## 2023-03-30 MED ORDER — LIP MEDEX EX OINT
TOPICAL_OINTMENT | Freq: Two times a day (BID) | CUTANEOUS | Status: DC
  Administered 2023-03-30: 75 via TOPICAL
  Filled 2023-03-30: qty 7

## 2023-03-30 MED ORDER — SIMETHICONE 40 MG/0.6ML PO SUSP: 80.0000 mg | Freq: Four times a day (QID) | ORAL | Status: DC | PRN

## 2023-03-30 MED ORDER — METHYLPREDNISOLONE SODIUM SUCC 125 MG IJ SOLR: 125.0000 mg | INTRAMUSCULAR | Status: AC

## 2023-03-30 MED ORDER — LACTATED RINGERS IV BOLUS: 1000.0000 mL | Freq: Three times a day (TID) | INTRAVENOUS | Status: DC | PRN

## 2023-03-30 MED ORDER — PROPOFOL 10 MG/ML IV BOLUS
INTRAVENOUS | Status: AC
  Filled 2023-03-30: qty 20

## 2023-03-30 MED ORDER — FENTANYL CITRATE (PF) 100 MCG/2ML IJ SOLN
INTRAMUSCULAR | Status: DC | PRN
  Administered 2023-03-30: 100 ug via INTRAVENOUS

## 2023-03-30 MED ORDER — PROCHLORPERAZINE EDISYLATE 10 MG/2ML IJ SOLN: 5.0000 mg | INTRAMUSCULAR | Status: DC | PRN

## 2023-03-30 MED ORDER — LACTATED RINGERS IR SOLN
Status: DC | PRN
  Administered 2023-03-30: 1000 mL

## 2023-03-30 MED ORDER — PROPOFOL 10 MG/ML IV BOLUS
INTRAVENOUS | Status: DC | PRN
  Administered 2023-03-30: 200 mg via INTRAVENOUS

## 2023-03-30 MED ORDER — DIPHENHYDRAMINE HCL 50 MG/ML IJ SOLN
12.5000 mg | Freq: Four times a day (QID) | INTRAMUSCULAR | Status: DC | PRN
  Filled 2023-03-30: qty 1

## 2023-03-30 MED ORDER — MIDAZOLAM HCL 5 MG/5ML IJ SOLN
INTRAMUSCULAR | Status: DC | PRN
  Administered 2023-03-30: 2 mg via INTRAVENOUS

## 2023-03-30 MED ORDER — BUPIVACAINE LIPOSOME 1.3 % IJ SUSP
INTRAMUSCULAR | Status: DC | PRN
  Administered 2023-03-30: 20 mL

## 2023-03-30 SURGICAL SUPPLY — 56 items
APPLIER CLIP 5 13 M/L LIGAMAX5 (MISCELLANEOUS)
APPLIER CLIP ROT 10 11.4 M/L (STAPLE)
BAG COUNTER SPONGE SURGICOUNT (BAG) IMPLANT
CABLE HIGH FREQUENCY MONO STRZ (ELECTRODE) ×1 IMPLANT
CHLORAPREP W/TINT 26 (MISCELLANEOUS) ×1 IMPLANT
CLIP APPLIE 5 13 M/L LIGAMAX5 (MISCELLANEOUS) IMPLANT
CLIP APPLIE ROT 10 11.4 M/L (STAPLE) IMPLANT
COVER SURGICAL LIGHT HANDLE (MISCELLANEOUS) ×1 IMPLANT
DEVICE TROCAR PUNCTURE CLOSURE (ENDOMECHANICALS) IMPLANT
DRAPE LAPAROSCOPIC ABDOMINAL (DRAPES) ×1 IMPLANT
DRAPE WARM FLUID 44X44 (DRAPES) ×1 IMPLANT
DRSG TEGADERM 2-3/8X2-3/4 SM (GAUZE/BANDAGES/DRESSINGS) ×2 IMPLANT
DRSG TEGADERM 4X4.75 (GAUZE/BANDAGES/DRESSINGS) ×1 IMPLANT
ELECT REM PT RETURN 15FT ADLT (MISCELLANEOUS) ×1 IMPLANT
ENDOLOOP SUT PDS II  0 18 (SUTURE)
ENDOLOOP SUT PDS II 0 18 (SUTURE) IMPLANT
GAUZE SPONGE 2X2 8PLY STRL LF (GAUZE/BANDAGES/DRESSINGS) ×1 IMPLANT
GLOVE ECLIPSE 8.0 STRL XLNG CF (GLOVE) ×1 IMPLANT
GLOVE INDICATOR 8.0 STRL GRN (GLOVE) ×1 IMPLANT
GOWN STRL REUS W/ TWL XL LVL3 (GOWN DISPOSABLE) ×1 IMPLANT
GOWN STRL REUS W/TWL XL LVL3 (GOWN DISPOSABLE) ×1
IRRIG SUCT STRYKERFLOW 2 WTIP (MISCELLANEOUS) ×1
IRRIGATION SUCT STRKRFLW 2 WTP (MISCELLANEOUS) ×1 IMPLANT
KIT BASIN OR (CUSTOM PROCEDURE TRAY) ×1 IMPLANT
KIT TURNOVER KIT A (KITS) IMPLANT
NDL INSUFFLATION 14GA 120MM (NEEDLE) ×1 IMPLANT
NEEDLE INSUFFLATION 14GA 120MM (NEEDLE) ×1 IMPLANT
PAD POSITIONING PINK XL (MISCELLANEOUS) ×1 IMPLANT
PENCIL SMOKE EVACUATOR (MISCELLANEOUS) IMPLANT
POUCH RETRIEVAL ECOSAC 10 (ENDOMECHANICALS) ×1 IMPLANT
POUCH RETRIEVAL ECOSAC 10MM (ENDOMECHANICALS) ×1
RELOAD STAPLE 60 3.6 BLU REG (STAPLE) IMPLANT
RELOAD STAPLE 60 4.1 GRN THCK (STAPLE) IMPLANT
RELOAD STAPLER BLUE 60MM (STAPLE) ×1 IMPLANT
RELOAD STAPLER GREEN 60MM (STAPLE) IMPLANT
SCISSORS LAP 5X35 DISP (ENDOMECHANICALS) ×1 IMPLANT
SEALER TISSUE G2 STRG ARTC 35C (ENDOMECHANICALS) IMPLANT
SET TUBE SMOKE EVAC HIGH FLOW (TUBING) ×1 IMPLANT
SHEARS HARMONIC ACE PLUS 36CM (ENDOMECHANICALS) ×1 IMPLANT
SLEEVE Z-THREAD 5X100MM (TROCAR) IMPLANT
SPIKE FLUID TRANSFER (MISCELLANEOUS) ×1 IMPLANT
STAPLE ECHEON FLEX 60 POW ENDO (STAPLE) IMPLANT
STAPLER RELOAD BLUE 60MM (STAPLE) ×1
STAPLER RELOAD GREEN 60MM (STAPLE)
SUT MNCRL AB 4-0 PS2 18 (SUTURE) ×1 IMPLANT
SUT PDS AB 0 CT1 36 (SUTURE) IMPLANT
SUT PDS AB 1 CT1 27 (SUTURE) ×1 IMPLANT
SUT SILK 2 0 SH (SUTURE) IMPLANT
TOWEL OR 17X26 10 PK STRL BLUE (TOWEL DISPOSABLE) ×1 IMPLANT
TRAY FOLEY MTR SLVR 14FR STAT (SET/KITS/TRAYS/PACK) ×1 IMPLANT
TRAY FOLEY MTR SLVR 16FR STAT (SET/KITS/TRAYS/PACK) IMPLANT
TRAY LAPAROSCOPIC (CUSTOM PROCEDURE TRAY) ×1 IMPLANT
TROCAR ADV FIXATION 12X100MM (TROCAR) ×1 IMPLANT
TROCAR ADV FIXATION 5X100MM (TROCAR) ×1 IMPLANT
TROCAR Z THREAD OPTICAL 12X100 (TROCAR) IMPLANT
TROCAR Z-THREAD OPTICAL 5X100M (TROCAR) ×1 IMPLANT

## 2023-03-30 NOTE — Progress Notes (Signed)
Pt reported scratchy throat and stuffy nose. Upon assessing pt it appeared she was having an allergic reaction. Rapid response called. See MAR for meds administered. Dr Michaell Cowing paged and Maczis PA came to bedside. Decision made to transfer to step down for closer monitoring. Report given at bedside.

## 2023-03-30 NOTE — Op Note (Signed)
PATIENT:  Susan Clements  32 y.o. female  Patient Care Team: Grayce Sessions, NP as PCP - General (Internal Medicine)  PRE-OPERATIVE DIAGNOSIS:  ACUTE APPENDICITIS  POST-OPERATIVE DIAGNOSIS:  ACUTE PHLEGMONOUS APPENDICITIS  PROCEDURE:  LAPAROSCOPIC APPENDECTOMY   SURGEON:  Ardeth Sportsman, MD  ASSISTANT:  OR Staff   ANESTHESIA:  General endotracheal intubation anesthesia (GETA) and Regional TRANSVERSUS ABDOMINIS PLANE (TAP) nerve block -BILATERAL for perioperative & postoperative pain control at the level of the transverse abdominis & preperitoneal spaces along the flank at the anterior axillary line, from subcostal ridge to iliac crest under laparoscopic guidance provided with liposomal bupivacaine (Experel) 20mL mixed with 50 mL of bupivicaine 0.25%  Estimated Blood Loss (EBL):   Total I/O In: 1710 [I.V.:1000; IV Piggyback:710] Out: 225 [Urine:200; Blood:25].   (See anesthesia record)  Delay start of Pharmacological VTE agent (>24hrs) due to concerns of significant anemia, surgical blood loss, or risk of bleeding?:  not applicable  DRAINS: (None)  SPECIMEN:  Appendix  DISPOSITION OF SPECIMEN:  Pathology  COUNTS:  Sponge, needle, & instrument counts CORRECT  PLAN OF CARE: Admit for overnight observation  PATIENT DISPOSITION:  PACU - hemodynamically stable.   INDICATIONS: Patient with concerning symptoms & work up suspicious for appendicitis.  Surgery was recommended:  The anatomy & physiology of the digestive tract was discussed.  The pathophysiology of appendicitis was discussed.  Natural history risks without surgery was discussed.   I feel the risks of no intervention will lead to serious problems that outweigh the operative risks; therefore, I recommended diagnostic laparoscopy with removal of appendix to remove the pathology.  Laparoscopic & open techniques were discussed.   I noted a good likelihood this will help address the problem.    Risks such as  bleeding, infection, abscess, leak, reoperation, possible ostomy, hernia, heart attack, death, and other risks were discussed.  Goals of post-operative recovery were discussed as well.  We will work to minimize complications.  Questions were answered.  The patient expresses understanding & wishes to proceed with surgery.  OR FINDINGS: Inflamed appendix w obvious phlegmon consistent with acute phlegmonous appendicitis.  No frank ischemia or gangrene, nor necrosis.  No obvious feculent peritonitis or fecalith.  No evidence of any inflammatory bowel disease.  Adnexa and ovaries normal.  No Meckel's diverticulum.  No evidence of cecal nor sigmoid diverticulitis.  No obvious cholecystitis.  Liver normal.  CASE DATA:  Type of patient?: LDOW CASE (Surgical Hospitalist [facility] Inpatient)  Status of Case? URGENT Add On  Infection Present At Time Of Surgery (PATOS)?  PHLEGMON  DESCRIPTION:   Informed consent was confirmed.  The patient underwent general anaesthesia without difficulty.  The patient was positioned appropriately.  VTE prevention in place.  The patient's abdomen was clipped, prepped, & draped in a sterile fashion.  Surgical timeout confirmed our plan.  Peritoneal entry with a laparoscopic port was obtained using Varess spring needle entry technique in the  upper abdomen as the patient was positioned in reverse Trendelenburg.  I induced carbon dioxide insufflation.  No change in end tidal CO2 measurements.  Full symmetrical abdominal distention.  Initial port was carefully placed the superior part of the umbilicus using optical entry technique..  Camera inspection revealed no injury.  Extra ports were carefully placed under direct laparoscopic visualization.  Upon entering the abdomen (organ space), I encountered a phlegmon involving the appendix .  It was resting along the right posterior pelvis.  Some omentum and terminal ileum and tried to walled  off.  No major abscess.  No frank ischemia or  massive perforation.  I mobilized the terminal ileum to proximal ascending colon in a lateral to medial fashion.  I took care to avoid injuring any retroperitoneal structures.  I freed the appendix off its attachments to the ascending colon and cecal mesentery.  I elevated the appendix. I transected through the mesial appendix, taking care to spare the ileocecal mesentery.  I was able to free off the base of the appendix which was still viable.  I stapled the appendix off the cecum using a laparoscopic stapler. I took a healthy cuff of viable cecum.  I placed the appendix inside an EcoSac bag and removed out the 3mm stapler port.  I did copious irrigation. Hemostasis was good in the mesoappendix, colon mesentery, and retroperitoneum. Staple line was intact on the cecum with no bleeding. I washed out the pelvis, retrohepatic space and right paracolic gutter. I washed out the left side as well.  Hemostasis is good. There was no perforation or injury.  Because the area cleaned up well after irrigation, I did not place a drain.  I closed the 12 mm stapler port site fascia with #1 PDS suture using a suture passer under direct laparoscopic visualization.   I aspirated the carbon dioxide. Ports removed.  I closed skin using 4-0 monocryl stitch.  Sterile dressings applied.  Patient was extubated and sent to the recovery room.   I suspect the patient is going used in the hospital at least overnight and will need antibiotics x 23 hours.  Ceftriaxone should cover it.  I discussed operative findings, updated the patient's status, discussed probable steps to recovery, and gave postoperative recommendations to the patient's spouse, Conley Simmonds.  Recommendations were made.  Questions were answered.  He expressed understanding & appreciation.  Questions answered. They expressed understanding and appreciation.  Ardeth Sportsman, M.D., F.A.C.S. Gastrointestinal and Minimally Invasive Surgery [facility] [address] [place], [place] [address] [phone] Main / Paging  03/30/2023 1:47 PM

## 2023-03-30 NOTE — Anesthesia Postprocedure Evaluation (Signed)
Anesthesia Post Note  Patient: Susan Clements  Procedure(s) Performed: APPENDECTOMY LAPAROSCOPIC     Patient location during evaluation: PACU Anesthesia Type: General Level of consciousness: awake and alert Pain management: pain level controlled Vital Signs Assessment: post-procedure vital signs reviewed and stable Respiratory status: spontaneous breathing, nonlabored ventilation and respiratory function stable Cardiovascular status: blood pressure returned to baseline Postop Assessment: no apparent nausea or vomiting Anesthetic complications: no Comments: Addendum: Per conversation with Dr. Michaell Cowing and his PA, patient had severe facial swelling within an hour of discharge from PACU on her postoperative floor. She also had difficulty swallowing and rapid response was called. Patient was treated with epinephrine, steroids, H1/2 antagonists and had prompt resolution of her symptoms.   Of note, she had no symptoms of allergic reaction either intraoperatively or in the PACU. There is concern from her other providers that she might have an allergy to an unknown anesthetic medication. Per her surgeon, she also reported to him that she had severe facial swelling after a spinal was placed for a C/S (this record is not available in Epic) and received epinephrine at that time. I have reviewed her chart to try to bring some clarity to the source of her allergy and am unable to find any clear inciting medication given in the [facility] this afternoon. She was admitted to the ICU for further care and monitoring.    No notable events documented.  Last Vitals:  Vitals:   03/30/23 1445 03/30/23 1503  BP: 112/77 121/74  Pulse: 80 84  Resp: 11 18  Temp: 36.7 C 36.8 C  SpO2: 100% 100%    Last Pain:  Vitals:   03/30/23 1445  TempSrc:   PainSc: Asleep                 Shanda Howells

## 2023-03-30 NOTE — Consult Note (Addendum)
Susan Clements, MRN:  [id number], DOB:  1991-07-19, LOS: 0 ADMISSION DATE:  03/29/2023, CONSULTATION DATE:  4/12 REFERRING MD:  Susan Clements, CHIEF COMPLAINT:  anaphylaxis    History of Present Illness:  32 year old female patient who presented to the emergency room on 4/11 with fairly acute onset of periumbilical pain, with associated nausea and vomiting.  No fever or chills.  Was seen emergently in the emergency room, CT scan raise concern for acute appendicitis and surgical team was consulted and admitted the patient.  She went to the operating room on 4/12 where she underwent laparoscopic appendectomy intraoperative findings notable for inflamed appendix with obvious phlegmon consistent with acute phlegmonous this appendicitis.  There is no obvious peritonitis.  She returned after postanesthesia to her medical room where she began to notice increased shortness of breath, and swelling of her tongue and lips.  On further questioning the patient recalled feeling like her face was "numb and swollen" upon awakening from anesthesia the surgical team was called and emergently administered: Benadryl, Solu-Medrol, Pepcid, and received an epinephrine pen injection.  Following this she had fairly rapid resolution of symptoms however she was transferred to the stepdown unit for observation overnight and critical care was asked to evaluate.  Pertinent  Medical History  Asthma, gestational diabetes, anxiety disorder, obesity, reflux, major depression disorder, anaphylaxis following anesthesia Significant Hospital Events: Including procedures, antibiotic start and stop dates in addition to other pertinent events   4/11 admitted with acute appendicitis 4/12: Status post laparoscopic appendectomy.  Rapid response called for concern about anaphylaxis  Interim History / Subjective:  Feels better currently this is  Objective   Blood pressure 121/74, pulse 84, temperature 98.2 F (36.8 C), resp. rate 18,  height  (1.626 m), weight 113.4 kg, SpO2 100 %, unknown if currently breastfeeding.        Intake/Output Summary (Last 24 hours) at 03/30/2023 1601 Last data filed at 03/30/2023 1345 Gross per 24 hour  Intake 2803.62 ml  Output 825 ml  Net 1978.62 ml   Filed Weights   03/29/23 1740  Weight: 113.4 kg    Examination: General: Otherwise healthy appearing 32 year old female currently lying in bed and in no acute distress HENT: Normocephalic atraumatic mucous membranes are moist no JVD no stridor swelling from tongue and lips has resolved Lungs: Clear to auscultation Cardiovascular: Regular rate and rhythm Abdomen: Soft, tender at umbilical site dressing clean dry and intact ice compress in place Extremities: Warm dry brisk capillary refill Neuro: Awake oriented GU: Due to void  Resolved Hospital Problem list     Assessment & Plan:  Anaphylaxis.  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 -She is status post epinephrine, Solu-Medrol, and H2 blockade and her symptoms have resolved Plan Agree with monitoring in the stepdown unit setting Every vital signs x 6, then can change to every 2 Will continue Solu-Medrol at 40 mg every 12 hours x 2 doses Continue scheduled Pepcid Continue PRN Benadryl  Acute appendicitis status post appendectomy 4/12 Plan Postoperative care as directed by general surgery Day #2 ceftriaxone As needed pain medications  History of asthma Plan As needed Ventolin  Best Practice (right click and "Reselect all SmartList Selections" daily)   Diet/type: clear liquids DVT prophylaxis: SCD GI prophylaxis: H2B Lines: N/A Foley:  N/A Code Status:  full code Last date of multidisciplinary goals of care discussion [per primary ]  Labs   CBC: Recent Labs  Lab 03/29/23 1749  WBC 10.5  HGB 12.5  HCT 38.3  MCV 93.6  PLT 331    Basic Metabolic Panel: Recent Labs   Lab 03/29/23 1749  NA 133*  K 3.5  CL 100  CO2 24  GLUCOSE 98  BUN 14  CREATININE 0.64  CALCIUM 10.0   GFR: Estimated Creatinine Clearance: 123.5 mL/min (by C-G formula based on SCr of 0.64 mg/dL). Recent Labs  Lab 03/29/23 1749  WBC 10.5    Liver Function Tests: Recent Labs  Lab 03/29/23 1749  AST 24  ALT 19  ALKPHOS 34*  BILITOT 0.7  PROT 7.9  ALBUMIN 4.2   Recent Labs  Lab 03/29/23 1749  LIPASE 29   No results for input(s): "AMMONIA" in the last 168 hours.  ABG No results found for: "PHART", "PCO2ART", "PO2ART", "HCO3", "TCO2", "ACIDBASEDEF", "O2SAT"   Coagulation Profile: No results for input(s): "INR", "PROTIME" in the last 168 hours.  Cardiac Enzymes: No results for input(s): "CKTOTAL", "CKMB", "CKMBINDEX", "TROPONINI" in the last 168 hours.  HbA1C: Hgb A1c MFr Bld  Date/Time Value Ref Range Status  09/14/2022 09:49 AM 5.2 4.8 - 5.6 % Final    Comment:             Prediabetes: 5.7 - 6.4          Diabetes: >6.4          Glycemic control for adults with diabetes: <7.0   11/05/2020 10:41 AM 4.8 4.8 - 5.6 % Final    Comment:             Prediabetes: 5.7 - 6.4          Diabetes: >6.4          Glycemic control for adults with diabetes: <7.0     CBG: No results for input(s): "GLUCAP" in the last 168 hours.  Review of Systems:   Review of Systems  Constitutional:  Negative for chills and fever.  HENT:  Positive for congestion.        Did have tongue, lip and facial swelling this is all subsided  Eyes: Negative.   Respiratory:         Shortness of breath has subsided  Cardiovascular: Negative.   Gastrointestinal:  Positive for abdominal pain.  Genitourinary: Negative.   Musculoskeletal: Negative.   Skin: Negative.   Endo/Heme/Allergies: Negative.      Past Medical History:  She,  has a past medical history of Asthma and Gestational diabetes.   Surgical History:   Past Surgical History:  Procedure Laterality Date   NO PAST  SURGERIES       Social History:   reports that she quit smoking about 2 years ago. Her smoking use included cigars and cigarettes. She smoked an average of .25 packs per day. She uses smokeless tobacco. She reports current alcohol use. She reports that she does not currently use drugs.   Family History:  Her family history includes Diabetes in her father and maternal grandmother; Healthy in her mother; Heart failure in her maternal grandmother; Hypertension in her maternal grandmother and paternal grandmother; Throat cancer in her maternal grandmother.   Allergies Allergies  Allergen Reactions   Shellfish-Derived Products Anaphylaxis   Nsaids      Home Medications  Prior to Admission medications   Medication Sig Start Date End Date Taking? Authorizing Provider  Ascorbic Acid (VITAMIN C PO) Take 1 tablet by mouth daily.   Yes [provider]  Norethindrone Acetate-Ethinyl Estrad-FE (LOESTRIN  24 FE) 1-20 MG-MCG(24) tablet Take 1 tablet by mouth daily. 11/23/22  Yes Brock Bad, MD  traMADol (ULTRAM) 50 MG tablet Take 1-2 tablets (50-100 mg total) by mouth every 6 (six) hours as needed for moderate pain or severe pain. 03/30/23  Yes Karie Soda, MD  VITAMIN D PO Take 1 tablet by mouth daily.   Yes [provider]  albuterol (VENTOLIN HFA) 108 (90 Base) MCG/ACT inhaler Inhale 2 puffs into the lungs every 4 (four) hours as needed for wheezing or shortness of breath. Patient not taking: Reported on 03/30/2023 09/21/22   Zenia Resides, MD     Critical care time:32 min    Simonne Martinet ACNP-BC [facility] Pager # [phone] OR # [phone] if no answer

## 2023-03-30 NOTE — Progress Notes (Signed)
Our team was paged that patient was having allergic reaction on rapid response had been called.    I arrived to the patient's room for evaluation. 25 mg IV Benadryl had already been given and patient had been placed on 2L o2.   Patient complained of facial swelling including eyes and lips. She denies any tongue swelling but did feel like she had difficulty swallowing her saliva and felt like there was "food stuck in the back of her throat".  No shortness of breath or rash.    On exam:  Gen: Awake and alert HEENT: Patient with periorbital edema. Some conjunctival/scleral edema as well. Lips appear swollen. No obvious tongue swelling. She is in control of secretions. Midline uvula but does appear edematous.  Neck: No stridor.  Heart: RRR Lungs: CTA b/l, no wheezing. No accessory muscle use. On o2 Abd: Soft, no distension, appropriately tender around laparoscopic incisions, no rigidity or guarding and otherwise NT, +BS. Incisions with gauze w/ overlying tegaderm intact appears well and are without drainage, bleeding, or signs of infection  Msk: No extremity edema.  Skin: no obvious rash  Interventions: I gave an additional 25 mg of IV Benadryl (50 mg IV Benadryl total), 40 mg IV famotidine, 125 mg IV Solu-Medrol and 0.3 mg IM epinephrine (EpiPen). Patient placed on cardiac monitoring.  I observe the patient until noted improvement.  Patient with significant improvement of facial swelling including her lips and periorbital edema after above interventions.  On repeat evaluation her uvula appears less edematous.  Patient reports she no longer felt like there is tightness of her throat and she was able to swallow her saliva without difficulty.  Her phonation improved and returned to her baseline.  Lungs remain clear to auscultation bilaterally. Given improvement no additional doses of IM epinephrine were given. I transferred the patient to stepdown.  I reassessed her when she arrived to stepdown to ensure  that things were continuing to improve.  She did not appear in any distress at this time.  I have consulted CCM for monitoring overnight in case this was to recur or worsen.  I updated patient's family (husband) in person and answered all questions.  Unclear the etiology of patient's allergic reaction.  She reports history of allergic reaction requiring EpiPen after spinal injection several years ago.  She has listed allergies to NSAIDs and shellfish.  Review of MAR I do not see she has received any NSAIDs here.  She has been receiving Rocephin since 0017 today and Flagyl since 0116 without issues. Only new medication I see that she was given postop with fentanyl at 1411. I removed scopolamine patch. I discussed with anesthesia who reports they had no concerns IntraOp or in PACU.  They recommend drawing a tryptase level to see if this is elevated. Ordered.  I have updated my attending on above.   Jacinto Halim, [facility] 03/30/23, 4:50 PM

## 2023-03-30 NOTE — TOC CM/SW Note (Signed)
  Transition of Care (TOC) Screening Note   Patient Details  Name: Susan Clements Date of Birth: September 03, 1991   Transition of Care [facility]) CM/SW Contact:    Amada Jupiter, LCSW Phone Number: 03/30/2023, 3:36 PM    Transition of Care Department [facility]) has reviewed patient and no TOC needs have been identified at this time. We will continue to monitor patient advancement through interdisciplinary progression rounds. If new patient transition needs arise, please place a TOC consult.

## 2023-03-30 NOTE — Transfer of Care (Signed)
Immediate Anesthesia Transfer of Care Note  Patient: Susan Clements  Procedure(s) Performed: APPENDECTOMY LAPAROSCOPIC  Patient Location: PACU  Anesthesia Type:General  Level of Consciousness: awake and alert   Airway & Oxygen Therapy: Patient Spontanous Breathing and Patient connected to face mask oxygen  Post-op Assessment: Report given to RN and Post -op Vital signs reviewed and stable  Post vital signs: Reviewed and stable  Last Vitals:  Vitals Value Taken Time  BP 117/68 03/30/23 1355  Temp    Pulse 90 03/30/23 1358  Resp 14 03/30/23 1358  SpO2 100 % 03/30/23 1358  Vitals shown include unvalidated device data.  Last Pain:  Vitals:   03/30/23 0929  TempSrc:   PainSc: 3       Patients Stated Pain Goal: 0 (03/30/23 0053)  Complications: No notable events documented.

## 2023-03-30 NOTE — Anesthesia Preprocedure Evaluation (Addendum)
Anesthesia Evaluation  Patient identified by MRN, date of birth, ID band Patient awake    Reviewed: Allergy & Precautions, NPO status , Patient's Chart, lab work & pertinent test results  Airway Mallampati: II  TM Distance: >3 FB Neck ROM: Full    Dental  (+) Teeth Intact, Dental Advisory Given   Pulmonary asthma , former smoker   Pulmonary exam normal breath sounds clear to auscultation       Cardiovascular negative cardio ROS Normal cardiovascular exam Rhythm:Regular Rate:Normal     Neuro/Psych  PSYCHIATRIC DISORDERS Anxiety Depression    negative neurological ROS     GI/Hepatic negative GI ROS, Neg liver ROS,,,  Endo/Other    Morbid obesity  Renal/GU negative Renal ROS     Musculoskeletal negative musculoskeletal ROS (+)    Abdominal   Peds  Hematology negative hematology ROS (+)   Anesthesia Other Findings Day of surgery medications reviewed with the patient.  Reproductive/Obstetrics negative OB ROS                             Anesthesia Physical Anesthesia Plan  ASA: 3  Anesthesia Plan: General   Post-op Pain Management: Tylenol PO (pre-op)* and Gabapentin PO (pre-op)*   Induction: Intravenous  PONV Risk Score and Plan: 4 or greater and Scopolamine patch - Pre-op, Midazolam, Dexamethasone and Ondansetron  Airway Management Planned: Oral ETT  Additional Equipment:   Intra-op Plan:   Post-operative Plan: Extubation in OR  Informed Consent: I have reviewed the patients History and Physical, chart, labs and discussed the procedure including the risks, benefits and alternatives for the proposed anesthesia with the patient or authorized representative who has indicated his/her understanding and acceptance.     Dental advisory given  Plan Discussed with: CRNA  Anesthesia Plan Comments:        Anesthesia Quick Evaluation

## 2023-03-30 NOTE — Progress Notes (Signed)
Remains comfortable s/p transfer to the ICU No current swelling or dyspnea Plan Cont systemic steroids x 2 dose Cont PRN H2B

## 2023-03-30 NOTE — Discharge Instructions (Signed)
SURGERY: POST OP INSTRUCTIONS (Surgery for small bowel obstruction, colon resection, etc)   ######################################################################  EAT Gradually transition to a high fiber diet with a fiber supplement over the next few days after discharge  WALK Walk an hour a day.  Control your pain to do that.    CONTROL PAIN Control pain so that you can walk, sleep, tolerate sneezing/coughing, go up/down stairs.  HAVE A BOWEL MOVEMENT DAILY Keep your bowels regular to avoid problems.  OK to try a laxative to override constipation.  OK to use an antidairrheal to slow down diarrhea.  Call if not better after 2 tries  CALL IF YOU HAVE PROBLEMS/CONCERNS Call if you are still struggling despite following these instructions. Call if you have concerns not answered by these instructions  ######################################################################   DIET Follow a light diet the first few days at home.  Start with a bland diet such as soups, liquids, starchy foods, low fat foods, etc.  If you feel full, bloated, or constipated, stay on a ful liquid or pureed/blenderized diet for a few days until you feel better and no longer constipated. Be sure to drink plenty of fluids every day to avoid getting dehydrated (feeling dizzy, not urinating, etc.). Gradually add a fiber supplement to your diet over the next week.  Gradually get back to a regular solid diet.  Avoid fast food or heavy meals the first week as you are more likely to get nauseated. It is expected for your digestive tract to need a few months to get back to normal.  It is common for your bowel movements and stools to be irregular.  You will have occasional bloating and cramping that should eventually fade away.  Until you are eating solid food normally, off all pain medications, and back to regular activities; your bowels will not be normal. Focus on eating a low-fat, high fiber diet the rest of your life  (See Getting to Good Bowel Health, below).  CARE of your INCISION or WOUND  It is good for closed incisions and even open wounds to be washed every day.  Shower every day.  Short baths are fine.  Wash the incisions and wounds clean with soap & water.    You may leave closed incisions open to air if it is dry.   You may cover the incision with clean gauze & replace it after your daily shower for comfort.  TEGADERM:  You have clear gauze band-aid dressings over your closed incision(s).  Remove the dressings 3 days after surgery.= MONDAY 4/15    ACTIVITIES as tolerated Start light daily activities --- self-care, walking, climbing stairs-- beginning the day after surgery.  Gradually increase activities as tolerated.  Control your pain to be active.  Stop when you are tired.  Ideally, walk several times a day, eventually an hour a day.   Most people are back to most day-to-day activities in a few weeks.  It takes 4-8 weeks to get back to unrestricted, intense activity. If you can walk 30 minutes without difficulty, it is safe to try more intense activity such as jogging, treadmill, bicycling, low-impact aerobics, swimming, etc. Save the most intensive and strenuous activity for last (Usually 4-8 weeks after surgery) such as sit-ups, heavy lifting, contact sports, etc.  Refrain from any intense heavy lifting or straining until you are off narcotics for pain control.  You will have off days, but things should improve week-by-week. DO NOT PUSH THROUGH PAIN.  Let pain be your guide: If  it hurts to do something, don't do it.  Pain is your body warning you to avoid that activity for another week until the pain goes down. You may drive when you are no longer taking narcotic prescription pain medication, you can comfortably wear a seatbelt, and you can safely make sudden turns/stops to protect yourself without hesitating due to pain. You may have sexual intercourse when it is comfortable. If it hurts to do  something, stop.  MEDICATIONS Take your usually prescribed home medications unless otherwise directed.   Blood thinners:  Usually you can restart any strong blood thinners after the second postoperative day.  It is OK to take aspirin right away.     If you are on strong blood thinners (warfarin/Coumadin, Plavix, Xerelto, Eliquis, Pradaxa, etc), discuss with your surgeon, medicine PCP, and/or cardiologist for instructions on when to restart the blood thinner & if blood monitoring is needed (PT/INR blood check, etc).     PAIN CONTROL Pain after surgery or related to activity is often due to strain/injury to muscle, tendon, nerves and/or incisions.  This pain is usually short-term and will improve in a few months.  To help speed the process of healing and to get back to regular activity more quickly, DO THE FOLLOWING THINGS TOGETHER: Increase activity gradually.  DO NOT PUSH THROUGH PAIN Use Ice and/or Heat Try Gentle Massage and/or Stretching Take over the counter pain medication Take Narcotic prescription pain medication for more severe pain  Good pain control = faster recovery.  It is better to take more medicine to be more active than to stay in bed all day to avoid medications.  Increase activity gradually Avoid heavy lifting at first, then increase to lifting as tolerated over the next 6 weeks. Do not "push through" the pain.  Listen to your body and avoid positions and maneuvers than reproduce the pain.  Wait a few days before trying something more intense Walking an hour a day is encouraged to help your body recover faster and more safely.  Start slowly and stop when getting sore.  If you can walk 30 minutes without stopping or pain, you can try more intense activity (running, jogging, aerobics, cycling, swimming, treadmill, sex, sports, weightlifting, etc.) Remember: If it hurts to do it, then don't do it! Use Ice and/or Heat You will have swelling and bruising around the incisions.   This will take several weeks to resolve. Ice packs or heating pads (6-8 times a day, 30-60 minutes at a time) will help sooth soreness & bruising. Some people prefer to use ice alone, heat alone, or alternate between ice & heat.  Experiment and see what works best for you.  Consider trying ice for the first few days to help decrease swelling and bruising; then, switch to heat to help relax sore spots and speed recovery. Shower every day.  Short baths are fine.  It feels good!  Keep the incisions and wounds clean with soap & water.   Try Gentle Massage and/or Stretching Massage at the area of pain many times a day Stop if you feel pain - do not overdo it Take over the counter pain medication This helps the muscle and nerve tissues become less irritable and calm down faster Choose ONE of the following over-the-counter anti-inflammatory medications: Acetaminophen  tabs (Tylenol) 1-2 pills with every meal and just before bedtime (avoid if you have liver problems or if you have acetaminophen in you narcotic prescription) Naproxen  tabs (ex. Aleve, Naprosyn) 1-2 pills  twice a day (avoid if you have kidney, stomach, IBD, or bleeding problems) Ibuprofen 200mg  tabs (ex. Advil, Motrin) 3-4 pills with every meal and just before bedtime (avoid if you have kidney, stomach, IBD, or bleeding problems) Take with food/snack several times a day as directed for at least 2 weeks to help keep pain / soreness down & more manageable. Take Narcotic prescription pain medication for more severe pain A prescription for strong pain control is often given to you upon discharge (for example: oxycodone/Percocet, hydrocodone/Norco/Vicodin, or tramadol/Ultram) Take your pain medication as prescribed. Be mindful that most narcotic prescriptions contain Tylenol (acetaminophen) as well - avoid taking too much Tylenol. If you are having problems/concerns with the prescription medicine (does not control pain, nausea,  vomiting, rash, itching, etc.), please call us [phone] to see if we need to switch you to a different pain medicine that will work better for you and/or control your side effects better. If you need a refill on your pain medication, you must call the office before 4 pm and on weekdays only.  By federal law, prescriptions for narcotics cannot be called into a pharmacy.  They must be filled out on paper & picked up from our office by the patient or authorized caretaker.  Prescriptions cannot be filled after 4 pm nor on weekends.    WHEN TO CALL us [phone] Severe uncontrolled or worsening pain  Fever over 101 F (38.5 C) Concerns with the incision: Worsening pain, redness, rash/hives, swelling, bleeding, or drainage Reactions / problems with new medications (itching, rash, hives, nausea, etc.) Nausea and/or vomiting Difficulty urinating Difficulty breathing Worsening fatigue, dizziness, lightheadedness, blurred vision Other concerns If you are not getting better after two weeks or are noticing you are getting worse, contact our office [phone] for further advice.  We may need to adjust your medications, re-evaluate you in the office, send you to the emergency room, or see what other things we can do to help. The clinic staff is available to answer your questions during regular business hours (8:30am-5pm).  Please don't hesitate to call and ask to speak to one of our nurses for clinical concerns.    A surgeon from [facility] is always on call at the hospitals 24 hours/day If you have a medical emergency, go to the nearest emergency room or call 911.  FOLLOW UP in our office One the day of your discharge from the hospital (or the next business weekday), please call [facility] to set up or confirm an appointment to see your surgeon in the office for a follow-up appointment.  Usually it is 2-3 weeks after your surgery.   If you have skin staples at your  incision(s), let the office know so we can set up a time in the office for the nurse to remove them (usually around 10 days after surgery). Make sure that you call for appointments the day of discharge (or the next business weekday) from the hospital to ensure a convenient appointment time. IF YOU HAVE DISABILITY OR FAMILY LEAVE FORMS, BRING THEM TO THE OFFICE FOR PROCESSING.  DO NOT GIVE THEM TO YOUR DOCTOR.  [facility] [place] [address], [address], [place], [place]  [address] ? [phone] - Main [phone] - Toll Free,  [phone] - Fax [email or web address]    GETTING TO GOOD BOWEL HEALTH. It is expected for your digestive tract to need a few months to get back to normal.  It is common  for your bowel movements and stools to be irregular.  You will have occasional bloating and cramping that should eventually fade away.  Until you are eating solid food normally, off all pain medications, and back to regular activities; your bowels will not be normal.   Avoiding constipation The goal: ONE SOFT BOWEL MOVEMENT A DAY!    Drink plenty of fluids.  Choose water first. TAKE A FIBER SUPPLEMENT EVERY DAY THE REST OF YOUR LIFE During your first week back home, gradually add back a fiber supplement every day Experiment which form you can tolerate.   There are many forms such as powders, tablets, wafers, gummies, etc Psyllium bran (Metamucil), methylcellulose (Citrucel), Miralax or Glycolax, Benefiber, Flax Seed.  Adjust the dose week-by-week (1/2 dose/day to 6 doses a day) until you are moving your bowels 1-2 times a day.  Cut back the dose or try a different fiber product if it is giving you problems such as diarrhea or bloating. Sometimes a laxative is needed to help jump-start bowels if constipated until the fiber supplement can help regulate your bowels.  If you are tolerating eating & you are farting, it is okay to try a gentle laxative such as double dose  MiraLax, prune juice, or Milk of Magnesia.  Avoid using laxatives too often. Stool softeners can sometimes help counteract the constipating effects of narcotic pain medicines.  It can also cause diarrhea, so avoid using for too long. If you are still constipated despite taking fiber daily, eating solids, and a few doses of laxatives, call our office. Controlling diarrhea Try drinking liquids and eating bland foods for a few days to avoid stressing your intestines further. Avoid dairy products (especially milk & ice cream) for a short time.  The intestines often can lose the ability to digest lactose when stressed. Avoid foods that cause gassiness or bloating.  Typical foods include beans and other legumes, cabbage, broccoli, and dairy foods.  Avoid greasy, spicy, fast foods.  Every person has some sensitivity to other foods, so listen to your body and avoid those foods that trigger problems for you. Probiotics (such as active yogurt, Align, etc) may help repopulate the intestines and colon with normal bacteria and calm down a sensitive digestive tract Adding a fiber supplement gradually can help thicken stools by absorbing excess fluid and retrain the intestines to act more normally.  Slowly increase the dose over a few weeks.  Too much fiber too soon can backfire and cause cramping & bloating. It is okay to try and slow down diarrhea with a few doses of antidiarrheal medicines.   Bismuth subsalicylate (ex. Kayopectate, Pepto Bismol) for a few doses can help control diarrhea.  Avoid if pregnant.   Loperamide (Imodium) can slow down diarrhea.  Start with one tablet ( ) first.  Avoid if you are having fevers or severe pain.  ILEOSTOMY PATIENTS WILL HAVE CHRONIC DIARRHEA since their colon is not in use.    Drink plenty of liquids.  You will need to drink even more glasses of water/liquid a day to avoid getting dehydrated. Record output from your ileostomy.  Expect to empty the bag every 3-4 hours at  first.  Most people with a permanent ileostomy empty their bag 4-6 times at the least.   Use antidiarrheal medicine (especially Imodium) several times a day to avoid getting dehydrated.  Start with a dose at bedtime & breakfast.  Adjust up or down as needed.  Increase antidiarrheal medications as directed to avoid emptying the  bag more than 8 times a day (every 3 hours). Work with your wound ostomy nurse to learn care for your ostomy.  See ostomy care instructions. TROUBLESHOOTING IRREGULAR BOWELS 1) Start with a soft & bland diet. No spicy, greasy, or fried foods.  2) Avoid gluten/wheat or dairy products from diet to see if symptoms improve. 3) Miralax 17gm or flax seed mixed in 8oz. water or juice-daily. May use 2-4 times a day as needed. 4) Gas-X, Phazyme, etc. as needed for gas & bloating.  5) Prilosec (omeprazole) over-the-counter as needed 6)  Consider probiotics (Align, Activa, etc) to help calm the bowels down  Call your doctor if you are getting worse or not getting better.  Sometimes further testing (cultures, endoscopy, X-ray studies, CT scans, bloodwork, etc.) may be needed to help diagnose and treat the cause of the diarrhea. [facility] [address], [address], [place], [place]  [address] [phone] - Main.    [phone]  - Toll Free.   [phone] - Fax [email or web address]

## 2023-03-30 NOTE — Anesthesia Procedure Notes (Signed)
Procedure Name: Intubation Date/Time: 03/30/2023 1:18 PM  Performed by: Deri Fuelling, CRNAPre-anesthesia Checklist: Patient identified, Emergency Drugs available, Suction available and Patient being monitored Patient Re-evaluated:Patient Re-evaluated prior to induction Oxygen Delivery Method: Circle system utilized Preoxygenation: Pre-oxygenation with 100% oxygen Induction Type: IV induction Ventilation: Mask ventilation without difficulty Laryngoscope Size: Mac and 3 Grade View: Grade I Tube type: Oral Number of attempts: 1 Airway Equipment and Method: Stylet and Oral airway Placement Confirmation: ETT inserted through vocal cords under direct vision, positive ETCO2 and breath sounds checked- equal and bilateral Secured at: 21 cm Tube secured with: Tape Dental Injury: Teeth and Oropharynx as per pre-operative assessment

## 2023-03-30 NOTE — Interval H&P Note (Signed)
History and Physical Interval Note:  03/30/2023 8:30 AM  Susan Clements  has presented today for surgery, with the diagnosis of APPENDICITIS.  The various methods of treatment have been discussed with the patient and family. After consideration of risks, benefits and other options for treatment, the patient has consented to  Procedure(s): APPENDECTOMY LAPAROSCOPIC (N/A) as a surgical intervention.  The patient's history has been reviewed, patient examined, no change in status, stable for surgery.  I have reviewed the patient's chart and labs.  Questions were answered to the patient's satisfaction.    The anatomy & physiology of the digestive tract was discussed.  The pathophysiology of appendicitis and other appendiceal disorders were discussed.  Natural history risks without surgery was discussed.   I feel the risks of no intervention will lead to serious problems that outweigh the operative risks; therefore, I recommended diagnostic laparoscopy with removal of appendix to remove the pathology.  Laparoscopic & open techniques were discussed.   I noted a good likelihood this will help address the problem.   Risks such as bleeding, infection, abscess, leak, reoperation, injury to other organs, need for repair of tissues / organs, possible ostomy, hernia, heart attack, stroke, death, and other risks were discussed.  Goals of post-operative recovery were discussed as well.  We will work to minimize complications.  Questions were answered.  The patient expresses understanding & wishes to proceed with surgery.   I have re-reviewed the the patient's records, history, medications, and allergies.  I have re-examined the patient.  I again discussed intraoperative plans and goals of post-operative recovery.  The patient agrees to proceed.  Susan Clements  May 04, 1991 [id number]  Patient Care Team: Patient, No Pcp Per as PCP - General (General Practice)  Patient Active Problem List   Diagnosis Date  Noted   Appendicitis 03/29/2023   Encounter for postpartum visit 06/13/2021   [redacted] weeks gestation of pregnancy 05/02/2021   Vaginal delivery 05/01/2021   IUD (intrauterine device) in place 05/01/2021   Shoulder dystocia, delivered 05/01/2021   Encounter for elective induction of labor 04/30/2021   BMI 45.0-49.9, adult 04/05/2021   Gestational diabetes 02/20/2021   Heartburn during pregnancy, antepartum 12/31/2020   Anxiety 12/31/2020   Major depressive disorder, single episode, severe    Obesity during pregnancy 11/05/2020   Supervision of high risk pregnancy, antepartum 10/29/2020    Past Medical History:  Diagnosis Date   Asthma    Gestational diabetes     Past Surgical History:  Procedure Laterality Date   NO PAST SURGERIES      Social History   Socioeconomic History   Marital status: Married    Spouse name: Not on file   Number of children: Not on file   Years of education: Not on file   Highest education level: Not on file  Occupational History   Not on file  Tobacco Use   Smoking status: Former    Packs/day: .25    Types: Cigars, Cigarettes    Quit date: 09/10/2020    Years since quitting: 2.5   Smokeless tobacco: Current   Tobacco comments:    on patch  Vaping Use   Vaping Use: Never used  Substance and Sexual Activity   Alcohol use: Yes    Comment: rarely   Drug use: Not Currently   Sexual activity: Yes    Partners: Male    Birth control/protection: OCP  Other Topics Concern   Not on file  Social History Narrative   Not on  file   Social Determinants of Health   Financial Resource Strain: Not on file  Food Insecurity: No Food Insecurity (03/30/2023)   Hunger Vital Sign    Worried About Running Out of Food in the Last Year: Never true    Ran Out of Food in the Last Year: Never true  Transportation Needs: No Transportation Needs (03/30/2023)   PRAPARE - Administrator, Civil Service (Medical): No    Lack of Transportation  (Non-Medical): No  Physical Activity: Not on file  Stress: Not on file  Social Connections: Not on file  Intimate Partner Violence: Not At Risk (03/30/2023)   Humiliation, Afraid, Rape, and Kick questionnaire    Fear of Current or Ex-Partner: No    Emotionally Abused: No    Physically Abused: No    Sexually Abused: No    Family History  Problem Relation Age of Onset   Healthy Mother    Diabetes Father    Throat cancer Maternal Grandmother    Hypertension Maternal Grandmother    Diabetes Maternal Grandmother    Heart failure Maternal Grandmother    Hypertension Paternal Grandmother     Medications Prior to Admission  Medication Sig Dispense Refill Last Dose   Ascorbic Acid (VITAMIN C PO) Take 1 tablet by mouth daily.   03/29/2023   Norethindrone Acetate-Ethinyl Estrad-FE (LOESTRIN 24 FE) 1-20 MG-MCG(24) tablet Take 1 tablet by mouth daily. 28 tablet 11 Past Week   VITAMIN D PO Take 1 tablet by mouth daily.   03/29/2023   albuterol (VENTOLIN HFA) 108 (90 Base) MCG/ACT inhaler Inhale 2 puffs into the lungs every 4 (four) hours as needed for wheezing or shortness of breath. (Patient not taking: Reported on 03/30/2023) 1 each 0 Not Taking    Current Facility-Administered Medications  Medication Dose Route Frequency Provider Last Rate Last Admin   0.9 %  sodium chloride infusion   Intravenous Continuous Chevis Pretty III, MD 75 mL/hr at 03/30/23 0015 New Bag at 03/30/23 0015   acetaminophen (TYLENOL) tablet 1,000 mg  1,000 mg Oral On Call to OR Karie Soda, MD       albuterol (PROVENTIL) (2.5 MG/3ML) 0.083% nebulizer solution 2.5 mg  2.5 mg Nebulization Q4H PRN Chevis Pretty III, MD       bupivacaine liposome (EXPAREL) 1.3 % injection 266 mg  20 mL Infiltration Once Karie Soda, MD       cefTRIAXone (ROCEPHIN) 2 g in sodium chloride 0.9 % 100 mL IVPB  2 g Intravenous Q24H Chevis Pretty III, MD 200 mL/hr at 03/30/23 0017 2 g at 03/30/23 0017   And   metroNIDAZOLE (FLAGYL) IVPB 500 mg  500 mg  Intravenous Q12H Chevis Pretty III, MD 100 mL/hr at 03/30/23 0116 500 mg at 03/30/23 0116   Chlorhexidine Gluconate Cloth 2 % PADS 6 each  6 each Topical Once Karie Soda, MD       And   Chlorhexidine Gluconate Cloth 2 % PADS 6 each  6 each Topical Once Karie Soda, MD       gabapentin (NEURONTIN) capsule 300 mg  300 mg Oral On Call to OR Karie Soda, MD       morphine (PF) 2 MG/ML injection 1-2 mg  1-2 mg Intravenous Q1H PRN Chevis Pretty III, MD       ondansetron (ZOFRAN-ODT) disintegrating tablet 4 mg  4 mg Oral Q6H PRN Chevis Pretty III, MD       Or   ondansetron [facility]) injection 4 mg  4 mg Intravenous Q6H PRN Chevis Pretty III, MD   4 mg at 03/30/23 0012   pantoprazole (PROTONIX) injection 40 mg  40 mg Intravenous QHS Chevis Pretty III, MD   40 mg at 03/30/23 0011     Allergies  Allergen Reactions   Nsaids     BP 132/76 (BP Location: Right Arm)   Pulse (!) 102   Temp 98.6 F (37 C) (Oral)   Resp 18   Ht  (1.626 m)   Wt 113.4 kg   SpO2 100%   BMI 42.91 kg/m   Labs: Results for orders placed or performed during the hospital encounter of 03/29/23 (from the past 48 hour(s))  Lipase, blood     Status: None   Collection Time: 03/29/23  5:49 PM  Result Value Ref Range   Lipase 29 11 - 51 U/L    Comment: Performed at [facility], [address], [place], [place] [address]  Comprehensive metabolic panel     Status: Abnormal   Collection Time: 03/29/23  5:49 PM  Result Value Ref Range   Sodium 133 (L) 135 - 145 mmol/L   Potassium 3.5 3.5 - 5.1 mmol/L   Chloride 100 98 - 111 mmol/L   CO2 24 22 - 32 mmol/L   Glucose, Bld 98 70 - 99 mg/dL    Comment: Glucose reference range applies only to samples taken after fasting for at least 8 hours.   BUN 14 6 - 20 mg/dL   Creatinine, Ser 5.28 0.44 - 1.00 mg/dL   Calcium 41.3 8.9 - 24.4 mg/dL   Total Protein 7.9 6.5 - 8.1 g/dL   Albumin 4.2 3.5 - 5.0 g/dL   AST 24 15 - 41 U/L   ALT 19 0 - 44 U/L   Alkaline  Phosphatase 34 (L) 38 - 126 U/L   Total Bilirubin 0.7 0.3 - 1.2 mg/dL   GFR, Estimated >01 >02 mL/min    Comment: (NOTE) Calculated using the CKD-EPI Creatinine Equation (2021)    Anion gap 9 5 - 15    Comment: Performed at [facility], [address], [place], [place] [address]  CBC     Status: Abnormal   Collection Time: 03/29/23  5:49 PM  Result Value Ref Range   WBC 10.5 4.0 - 10.5 K/uL   RBC 4.09 3.87 - 5.11 MIL/uL   Hemoglobin 12.5 12.0 - 15.0 g/dL   HCT 64.4 03.4 - 74.2 %   MCV 93.6 80.0 - 100.0 fL   MCH 30.6 26.0 - 34.0 pg   MCHC 32.6 30.0 - 36.0 g/dL   RDW 59.5 (L) 63.8 - 75.6 %   Platelets 331 150 - 400 K/uL   nRBC 0.0 0.0 - 0.2 %    Comment: Performed at [facility], [address] [place], [place] [address]  Urinalysis, Routine w reflex microscopic -Urine, Clean Catch     Status: Abnormal   Collection Time: 03/29/23  5:49 PM  Result Value Ref Range   Color, Urine YELLOW YELLOW   APPearance CLEAR CLEAR   Specific Gravity, Urine >1.046 (H) 1.005 - 1.030   pH 5.0 5.0 - 8.0   Glucose, UA NEGATIVE NEGATIVE mg/dL   Hgb urine dipstick NEGATIVE NEGATIVE   Bilirubin Urine NEGATIVE NEGATIVE   Ketones, ur NEGATIVE NEGATIVE mg/dL   Protein, ur NEGATIVE NEGATIVE mg/dL   Nitrite POSITIVE (A) NEGATIVE   Leukocytes,Ua NEGATIVE NEGATIVE   RBC / HPF 0-5 0 -  5 RBC/hpf   WBC, UA 0-5 0 - 5 WBC/hpf   Bacteria, UA NONE SEEN NONE SEEN   Squamous Epithelial / HPF 6-10 0 - 5 /HPF   Mucus PRESENT     Comment: Performed at [facility], [address], [place], [place] [address]  I-Stat beta hCG blood, ED     Status: None   Collection Time: 03/29/23  5:57 PM  Result Value Ref Range   I-stat hCG, quantitative <5.0 <5 mIU/mL   Comment 3            Comment:   GEST. AGE      CONC.  (mIU/mL)   <=1 WEEK        5 - 50     2 WEEKS       50 - 500     3 WEEKS       100 - 10,000     4 WEEKS     1,000 - 30,000        FEMALE AND  NON-PREGNANT FEMALE:     LESS THAN 5 mIU/mL     Imaging / Studies: CT ABDOMEN PELVIS W CONTRAST  Result Date: 03/29/2023 CLINICAL DATA:  Severe abdominal pain since 2 hours ago, nausea, loose bowel movement today EXAM: CT ABDOMEN AND PELVIS WITH CONTRAST TECHNIQUE: Multidetector CT imaging of the abdomen and pelvis was performed using the standard protocol following bolus administration of intravenous contrast. RADIATION DOSE REDUCTION: This exam was performed according to the departmental dose-optimization program which includes automated exposure control, adjustment of the mA and/or kV according to patient size and/or use of iterative reconstruction technique. CONTRAST:  OMNIPAQUE IOHEXOL 300 MG/ML  SOLN COMPARISON:  None Available. FINDINGS: Lower chest: No acute abnormality. Hepatobiliary: No solid liver abnormality is seen. No gallstones, gallbladder wall thickening, or biliary dilatation. Pancreas: Unremarkable. No pancreatic ductal dilatation or surrounding inflammatory changes. Spleen: Normal in size without significant abnormality. Adrenals/Urinary Tract: Adrenal glands are unremarkable. Kidneys are normal, without renal calculi, solid lesion, or hydronephrosis. Bladder is unremarkable. Stomach/Bowel: Stomach is within normal limits. Wall thickening and fat stranding of the appendix measuring up to 1.4 cm in caliber (series 2, image 63, series 4, image 53). Vascular/Lymphatic: No significant vascular findings are present. No enlarged abdominal or pelvic lymph nodes. Reproductive: No mass or other significant abnormality. Benign functional cyst of the right ovary measuring 3.2 x 2.1 cm, for which no further follow-up or characterization is required. No follow-up imaging recommended. Note: This recommendation does not apply to premenarchal patients and to those with increased risk (genetic, family history, elevated tumor markers or other high-risk factors) of ovarian cancer. Reference: JACR 2020  Feb; 17(2):248-254 Other: No abdominal wall hernia or abnormality. No ascites. Musculoskeletal: No acute or significant osseous findings. Chronic bilateral pars defects of L5. IMPRESSION: Wall thickening and fat stranding of the appendix measuring up to 1.4 cm in caliber, consistent with acute appendicitis. No evidence of complicating perforation or abscess. Electronically Signed   By: Jearld Lesch M.D.   On: 03/29/2023 20:44     .Ardeth Sportsman, M.D., F.A.C.S. Gastrointestinal and Minimally Invasive Surgery [facility] [address], [address] [place], [place] [address] [phone] Main / Paging  03/30/2023 8:30 AM    Ardeth Sportsman

## 2023-03-31 ENCOUNTER — Encounter (HOSPITAL_COMMUNITY): Payer: Self-pay | Admitting: Surgery

## 2023-03-31 DIAGNOSIS — K35209 Acute appendicitis with generalized peritonitis, without abscess, unspecified as to perforation: Secondary | ICD-10-CM | POA: Diagnosis not present

## 2023-03-31 LAB — GLUCOSE, CAPILLARY: Glucose-Capillary: 170 mg/dL — ABNORMAL HIGH (ref 70–99)

## 2023-03-31 NOTE — Progress Notes (Signed)
Patient ID: Susan Clements, female   DOB: [birth year], 32 y.o.   MRN: [id number]   Acute Care Surgery Service Progress Note:    Chief Complaint/Subjective: No c/o Resting comfortably No n/v/trouble swallowing/sob  Objective: Vital signs in last 24 hours: Temp:  [97.8 F (36.6 C)-98.6 F (37 C)] 97.8 F (36.6 C) (04/13 0400) Pulse Rate:  [50-93] 51 (04/13 0700) Resp:  [10-18] 11 (04/13 0700) BP: (94-143)/(52-103) 143/103 (04/13 0700) SpO2:  [88 %-100 %] 100 % (04/13 0700) Last BM Date : 03/29/23  Intake/Output from previous day: 04/12 0701 - 04/13 0700 In: 2814.6 [I.V.:2050.6; IV Piggyback:764] Out: 675 [Urine:650; Blood:25] Intake/Output this shift: No intake/output data recorded.  Lungs: cta, nonlabored  Face: no facial swelling/no eyelid/lip swelling; normal phonation  Cardiovascular: reg  Abd: soft, approp TTP, dressings c/d/i  Extremities: no edema, +SCDs  Neuro: alert, nonfocal  Lab Results: CBC  Recent Labs    03/29/23 1749  WBC 10.5  HGB 12.5  HCT 38.3  PLT 331   BMET Recent Labs    03/29/23 1749  NA 133*  K 3.5  CL 100  CO2 24  GLUCOSE 98  BUN 14  CREATININE 0.64  CALCIUM 10.0   LFT    Latest Ref Rng & Units 03/29/2023    5:49 PM 09/14/2022    9:49 AM 11/05/2020   10:41 AM  Hepatic Function  Total Protein 6.5 - 8.1 g/dL 7.9  6.6  6.4   Albumin 3.5 - 5.0 g/dL 4.2  4.2  3.9   AST 15 - 41 U/L 24  15  20    ALT 0 - 44 U/L 19  19  19    Alk Phosphatase 38 - 126 U/L 34  49  38   Total Bilirubin 0.3 - 1.2 mg/dL 0.7  0.4  0.3    PT/INR No results for input(s): "LABPROT", "INR" in the last 72 hours. ABG No results for input(s): "PHART", "HCO3" in the last 72 hours.  Invalid input(s): "PCO2", "PO2"  Studies/Results:  Anti-infectives: Anti-infectives (From admission, onward)    Start     Dose/Rate Route Frequency Ordered Stop   03/29/23 2359  metroNIDAZOLE (FLAGYL) IVPB 500 mg       See Hyperspace for full Linked Orders Report.    500 mg 100 mL/hr over 60 Minutes Intravenous Every 12 hours 03/29/23 2352 04/05/23 2359   03/29/23 2352  cefTRIAXone (ROCEPHIN) 2 g in sodium chloride 0.9 % 100 mL IVPB       See Hyperspace for full Linked Orders Report.   2 g 200 mL/hr over 30 Minutes Intravenous Every 24 hours 03/29/23 2352 04/06/23 0030       Medications: Scheduled Meds:  acetaminophen  1,000 mg Oral Q6H   vitamin C  500 mg Oral BID   EPINEPHrine  1 mg Intramuscular Once   lip balm   Topical BID   methylPREDNISolone (SOLU-MEDROL) injection  40 mg Intravenous Q12H   multivitamin with minerals  1 tablet Oral Daily   pantoprazole (PROTONIX) IV  40 mg Intravenous QHS   polycarbophil  625 mg Oral BID   sodium chloride flush  3 mL Intravenous Q12H   Continuous Infusions:  sodium chloride 100 mL/hr at 03/30/23 1805   sodium chloride     cefTRIAXone (ROCEPHIN)  IV Stopped (03/31/23 0349)   And   metronidazole Stopped (03/31/23 0349)   famotidine (PEPCID) IV     lactated ringers     methocarbamol (ROBAXIN) IV     PRN  Meds:.sodium chloride, albuterol, alum & mag hydroxide-simeth, bisacodyl, diphenhydrAMINE, HYDROmorphone (DILAUDID) injection, lactated ringers, magic mouthwash, menthol-cetylpyridinium, methocarbamol (ROBAXIN) IV, methocarbamol, metoprolol tartrate, morphine injection, ondansetron **OR** ondansetron (ZOFRAN) IV, phenol, prochlorperazine, simethicone, sodium chloride flush, traMADol  Assessment/Plan: Patient Active Problem List   Diagnosis Date Noted   Appendicitis 03/29/2023   Encounter for postpartum visit 06/13/2021   [redacted] weeks gestation of pregnancy 05/02/2021   Vaginal delivery 05/01/2021   IUD (intrauterine device) in place 05/01/2021   Shoulder dystocia, delivered 05/01/2021   Encounter for elective induction of labor 04/30/2021   BMI 45.0-49.9, adult 04/05/2021   Gestational diabetes 02/20/2021   Heartburn during pregnancy, antepartum 12/31/2020   Anxiety 12/31/2020   Major depressive  disorder, single episode, severe    Obesity during pregnancy 11/05/2020   Supervision of high risk pregnancy, antepartum 10/29/2020   s/p Procedure(s): APPENDECTOMY LAPAROSCOPIC 03/30/2023 Dr Michaell Cowing Perioperative allergic reaction Severe obesity  Etiology of allergic reaction unclear - appears to have resolved  Adv diet to regular Vitals ok Ambulate Discussed dc instructions   Disposition: plan discharge later this AM  LOS: 0 days    Mary Sella. Andrey Campanile, MD, FACS General, Bariatric, & Minimally Invasive Surgery [phone] [facility]

## 2023-03-31 NOTE — Progress Notes (Signed)
No overnight events  Critical care will sign off

## 2023-04-01 LAB — TRYPTASE: Tryptase: 3.9 ug/L (ref 2.2–13.2)

## 2023-04-02 ENCOUNTER — Telehealth: Payer: Self-pay

## 2023-04-02 LAB — SURGICAL PATHOLOGY

## 2023-04-02 NOTE — Transitions of Care (Post Inpatient/ED Visit) (Signed)
   04/02/2023  Name: Susan Clements MRN: [id number] DOB: Mar 09, 1991  Today's TOC FU Call Status: Today's TOC FU Call Status:: Unsuccessul Call (1st Attempt) Unsuccessful Call (1st Attempt) Date: 04/02/23  Attempted to reach the patient regarding the most recent Inpatient/ED visit.  Follow Up Plan: Additional outreach attempts will be made to reach the patient to complete the Transitions of Care (Post Inpatient/ED visit) call.   Signature  Robyne Peers, RN

## 2023-04-03 ENCOUNTER — Telehealth: Payer: Self-pay

## 2023-04-03 NOTE — Transitions of Care (Post Inpatient/ED Visit) (Signed)
   04/03/2023  Name: Susan Clements MRN: [id number] DOB: 03-Apr-1991  Today's TOC FU Call Status: Today's TOC FU Call Status:: Successful TOC FU Call Competed Unsuccessful Call (1st Attempt) Date: 04/02/23 [facility] FU Call Complete Date: 04/03/23  Transition Care Management Follow-up Telephone Call Date of Discharge: 03/31/23 Discharge Facility: [facility]) Type of Discharge: Inpatient Admission Primary Inpatient Discharge Diagnosis:: appendicitis How have you been since you were released from the hospital?: Better Any questions or concerns?: Yes Patient Questions/Concerns:: She explained that she will speak to her surgeon about the anaphylaxis  she experienced after surgery. he would like to know waht should be added to her allergy profile Patient Questions/Concerns Addressed: Other: (patient will discuss with her surgeon.)  Items Reviewed: Did you receive and understand the discharge instructions provided?: Yes Medications obtained and verified?: Yes (Medications Reviewed) (She said she has all of her medications and did not have any questions about the med regime,) Any new allergies since your discharge?: No (No new allergies since discharge but she will discuss the anaphylaxis reaction she experienced post surgery.) Dietary orders reviewed?: Yes Type of Diet Ordered:: heart healthy.  She said she does not have much of an appetite yet. Do you have support at home?: Yes People in Home: spouse  Home Care and Equipment/Supplies: Were Home Health Services Ordered?: No Any new equipment or medical supplies ordered?: No  Functional Questionnaire: Do you need assistance with bathing/showering or dressing?: No Do you need assistance with meal preparation?: No Do you need assistance with eating?: No Do you have difficulty maintaining continence: No Do you need assistance with getting out of bed/getting out of a chair/moving?: No Do you have difficulty managing or taking your  medications?: No  Follow up appointments reviewed: PCP Follow-up appointment confirmed?: No (She said she will call to schedule a follow up appointment) MD Provider Line Number:[phone] Given: No Specialist Hospital Follow-up appointment confirmed?: No Reason Specialist Follow-Up Not Confirmed: Patient has Specialist Provider Number and will Call for Appointment Do you need transportation to your follow-up appointment?: No Do you understand care options if your condition(s) worsen?: Yes-patient verbalized understanding    SIGNATURE Robyne Peers, RN

## 2023-04-11 NOTE — Discharge Summary (Signed)
Patient ID: Susan Clements [id number] [birth year] 32 y.o.  Admit date: 03/29/2023 Discharge date: 03/31/23   Discharge Diagnosis Acute appendicitis s/p laparoscopic appendectomy on 4/12 Allergic Rxn  Consultants CCM/Pulm  H&P The patient is a 32 year old female who began having abdominal pain this afternoon.  The pain was mostly periumbilical.  The pain was associated with some nausea and vomiting.  She denies any fevers or chills.  She came to the emergency department where a CT scan showed possible early appendicitis.  She is otherwise in good health except for some asthma.  She has never had abdominal surgery before.   Procedures Dr. Michaell Cowing - 03/30/23 - Laparoscopic Appendectomy  Hospital Course:  Patient presented as above and was found to have acute appendicitis. Was taken to the OR by Dr. Michaell Cowing and underwent Laparoscopic Appendectomy on 4/12. Post op course was complicated by allergic rxn requiring epi. Unclear etiology. CCM consulted for assistance. Symptoms improved and resolved prior to d/c. On 4/13 patient was felt stable for d/c home.  On day of discharge - I was not directly involved in this patient's care and did not see the patient - therefore some of the information in this discharge summary was taken from the chart. Please see MD's progress note for more information on day of discharge.   Physical Exam: Please see MD's progress note from day of discharge   Allergies as of 03/31/2023       Reactions   Shellfish-derived Products Anaphylaxis   Nsaids         Medication List     TAKE these medications    albuterol 108 (90 Base) MCG/ACT inhaler Commonly known as: VENTOLIN HFA Inhale 2 puffs into the lungs every 4 (four) hours as needed for wheezing or shortness of breath.   Norethindrone Acetate-Ethinyl Estrad-FE 1-20 MG-MCG(24) tablet Commonly known as: LOESTRIN 24 FE Take 1 tablet by mouth daily.   traMADol 50 MG tablet Commonly known as:  ULTRAM Take 1-2 tablets (50-100 mg total) by mouth every 6 (six) hours as needed for moderate pain or severe pain.   VITAMIN C PO Take 1 tablet by mouth daily.   VITAMIN D PO Take 1 tablet by mouth daily.               Discharge Care Instructions  (From admission, onward)           Start     Ordered   03/30/23 0000  Discharge wound care:       Comments: It is good for closed incisions and even open wounds to be washed every day.  Shower every day.  Short baths are fine.  Wash the incisions and wounds clean with soap & water.    You may leave closed incisions open to air if it is dry.   You may cover the incision with clean gauze & replace it after your daily shower for comfort.  TEGADERM:  You have clear gauze band-aid dressings over your closed incision(s).  Remove the dressings 3 days after surgery = 4/15 [facility]   03/30/23 1354              Follow-up Information     Dayne Chait, Hedda Slade, PA-C Follow up.   Specialty: General Surgery Why: Please call to confirm your appointment date/time, Bring a copy of your photo ID & insurance card, Arrive 30 minutes prior to your appointment for paperwork Contact information: [address] [place] [address] [phone]  Signed: Leary Roca, [facility] 04/11/2023, 2:58 PM Please see Amion for pager number during day hours 7:00am-4:30pm

## 2023-04-19 ENCOUNTER — Encounter (HOSPITAL_COMMUNITY): Payer: Self-pay | Admitting: Behavioral Health

## 2023-04-19 ENCOUNTER — Ambulatory Visit (HOSPITAL_COMMUNITY)
Admission: EM | Admit: 2023-04-19 | Discharge: 2023-04-19 | Disposition: A | Discharge status: Home / Self Care | Payer: 59 | Attending: Behavioral Health | Admitting: Behavioral Health

## 2023-04-19 ENCOUNTER — Ambulatory Visit (INDEPENDENT_AMBULATORY_CARE_PROVIDER_SITE_OTHER): Payer: Self-pay | Admitting: *Deleted

## 2023-04-19 DIAGNOSIS — F419 Anxiety disorder, unspecified: Secondary | ICD-10-CM | POA: Diagnosis not present

## 2023-04-19 NOTE — ED Provider Notes (Signed)
Behavioral Health Urgent Care Medical Screening Exam  Patient Name: Susan Clements MRN: [id number] Date of Evaluation: 04/19/23 Chief Complaint:  "I'm overwhelmed and anxious all the time" Diagnosis:  Final diagnoses:  Anxiety   History of Present Illness: Susan Clements is a 32 y.o. female patient with a past psychiatric history of MDD and anxiety who presented voluntarily and unaccompanied to [facility] for a walk-in assessment with complaints of worsening anxiety.    Patient assessed face-to-face by this provider and chart reviewed on 04/19/23. Counselor Brenton Grills present during assessment. On evaluation, Susan Clements is seated in assessment area in no acute distress. Patient is alert and oriented x4, cooperative and pleasant. Speech is clear and coherent, normal rate and volume. Eye contact is good. Mood is depressed and anxious with tearful affect. Thought process is coherent with logical thought content. Patient denies suicidal and homicidal ideations and easily contracts verbally for safety with this Clinical research associate. Patient denies a history of suicide attempts or self-harm. Patient reports a past psychiatric hospitalization when she was 32 y/o. Per chart review, patient presented to [facility] for depression and was discharged with outpatient resources for therapy and medication management. Patient denies auditory and visual hallucinations. Patient denies symptoms of paranoia. Patient is able to converse coherently with goal-directed thoughts and no distractibility or preoccupation. Objectively, there is no evidence of psychosis/mania, delusional thinking, or indication that patient is responding to internal or external stimuli.  Patient endorses poor sleep (2 hours/night) and poor appetite since Friday. Patient lives in [place] with her husband and 3 children (ages 24, 6, 1). Patient denies access to weapons. Patient endorses occasional alcohol use, which she feels has recently  increased due to her increased stress/anxiety. Patient states she had a glass of wine today and has consumed 3 bottles of wine over the "past few days." Patient denies use of other illicit substances. Patient works from home at [employer]. Patient does not currently take any psychotropic medication and does not have outpatient psychiatric services currently in place for therapy/medication management. Patient states she is interested in receiving outpatient psychiatric resources today. Patient states she last took anxiety medication when she was pregnant in 2022 but does not remember the name of the medication. Per chart review, patient was prescribed hydroxyzine pamoate 25mg  TID PRN in 2022. Patient states she has been struggling with anxiety for several years. Patient states "I'm overwhelmed and anxious all the time, I feel like everyone is pulling me, I feel like I can't get a break." Patient states that her current stressors are working from home, marital issues, and caring for her 3 children. Patient states that she doesn't want to be at home and has difficulty concentrating. Patient states she has a good support system that consists of her family/friends, specifically her sisters.   Patient offered support and encouragement. Discussed ways to manage anxiety, such as mindfulness-based stress reduction techniques and making time for self. Discussed following up with outpatient psychiatric resources provided in AVS for therapy and medication management. Patient is in agreement with plan of care.   At this time, Susan Clements is educated and verbalizes understanding of mental health resources and other crisis services in the community. She is instructed to call 911 and present to the nearest emergency room should she experience any suicidal/homicidal ideation, auditory/visual/hallucinations, or detrimental worsening of her mental health condition. She was also advised by Clinical research associate that she could call  the toll-free phone on back of  insurance card to assist with identifying in  network services and agencies or the number on back of Medicaid card to speak with care coordinator.  Flowsheet Row ED from 04/19/2023 in [facility] ED to Hosp-Admission (Discharged) from 03/29/2023 in [facility]STEPDOWN ED from 09/21/2022 in [facility] at [facility] RISK CATEGORY No Risk No Risk No Risk       Psychiatric Specialty Exam  Presentation  General Appearance:Appropriate for Environment; Casual  Eye Contact:Good  Speech:Clear and Coherent; Normal Rate  Speech Volume:Normal  Handedness:Right   Mood and Affect  Mood: Anxious; Depressed  Affect: Tearful; Congruent   Thought Process  Thought Processes: Coherent; Goal Directed  Descriptions of Associations:Intact  Orientation:Full (Time, Place and Person)  Thought Content:Logical  Diagnosis of Schizophrenia or Schizoaffective disorder in past: No data recorded  Hallucinations:None  Ideas of Reference:None  Suicidal Thoughts:No  Homicidal Thoughts:No   Sensorium  Memory: Immediate Good; Recent Good; Remote Good  Judgment: Good  Insight: Good   Executive Functions  Concentration: Good  Attention Span: Good  Recall: Good  Fund of Knowledge: Good  Language: Good   Psychomotor Activity  Psychomotor Activity: Normal   Assets  Assets: Communication Skills; Desire for Improvement; Financial Resources/Insurance; Housing; Intimacy; Leisure Time; Physical Health; Resilience; Social Support; Talents/Skills; Transportation   Sleep  Sleep: Poor  Number of hours:  2   Physical Exam: Physical Exam Vitals and nursing note reviewed.  Constitutional:      General: She is not in acute distress.    Appearance: Normal appearance. She is not ill-appearing.  HENT:     Head: Normocephalic and atraumatic.     Nose: Nose normal.  Eyes:      General:        Right eye: No discharge.        Left eye: No discharge.     Conjunctiva/sclera: Conjunctivae normal.  Cardiovascular:     Rate and Rhythm: Normal rate.  Pulmonary:     Effort: Pulmonary effort is normal. No respiratory distress.  Musculoskeletal:        General: Normal range of motion.     Cervical back: Normal range of motion.  Skin:    General: Skin is warm and dry.  Neurological:     General: No focal deficit present.     Mental Status: She is alert and oriented to person, place, and time. Mental status is at baseline.  Psychiatric:        Attention and Perception: Attention and perception normal.        Mood and Affect: Mood is anxious and depressed. Affect is tearful.        Speech: Speech normal.        Behavior: Behavior normal. Behavior is cooperative.        Thought Content: Thought content normal. Thought content is not paranoid or delusional. Thought content does not include homicidal or suicidal ideation. Thought content does not include homicidal or suicidal plan.        Cognition and Memory: Cognition and memory normal.        Judgment: Judgment normal.    Review of Systems  Constitutional: Negative.   HENT: Negative.    Eyes: Negative.   Respiratory: Negative.    Cardiovascular: Negative.   Gastrointestinal: Negative.   Genitourinary: Negative.   Musculoskeletal: Negative.   Skin: Negative.   Neurological: Negative.   Endo/Heme/Allergies: Negative.   Psychiatric/Behavioral:  Positive for depression. Negative for hallucinations, memory loss, substance abuse and suicidal ideas.  The patient is nervous/anxious and has insomnia.    Blood pressure 130/77, pulse 96, temperature 98.8 F (37.1 C), resp. rate 19, SpO2 100 %, unknown if currently breastfeeding. There is no height or weight on file to calculate BMI.  Musculoskeletal: Strength & Muscle Tone: within normal limits Gait & Station: normal Patient leans: N/A   [facility] MSE Discharge  Disposition for Follow up and Recommendations: Based on my evaluation the patient does not appear to have an emergency medical condition and can be discharged with resources and follow up care in outpatient services for Medication Management and Individual Therapy   Sunday Corn, NP 04/19/2023, 6:39 PM

## 2023-04-19 NOTE — Progress Notes (Signed)
   04/19/23 1743  [facility] Triage Screening (Walk-ins at [facility] only)  How Did You Hear About [place]? Self  What Is the Reason for Your Visit/Call Today? Pt is a 32 yo female who present voluntarily to [facility] requesting outpatient services due to anxiety and depression. Pt presents tearful with a depressed mood. Pt reports that she is becoming overwhelmed with working at home while taking care of her 3 children (17 yo, 39 yo, and 1 yo). Pt also reported martial issues. Pt states "I feel like I am being pulled from every direction." Pt denies SI, HI, and AVH. Pt reports that she is not eating and sleeping. Pt reports that she is avg about 2 hours of sleep a night. Pt reports that she is currently not on any psychotropic medication. Pt is not seeing a therapist at this time. Pt denies substance use. Pt reports that she is open to seeking outpatient services.  How Long Has This Been Causing You Problems? > than 6 months  Have You Recently Had Any Thoughts About Hurting Yourself? No  Are You Planning to Commit Suicide/Harm Yourself At This time? No  Have you Recently Had Thoughts About Hurting Someone Karolee Ohs? No  Are You Planning To Harm Someone At This Time? No  Are you currently experiencing any auditory, visual or other hallucinations? No  Have You Used Any Alcohol or Drugs in the Past 24 Hours? Yes  How long ago did you use Drugs or Alcohol? Pt reports drinking wine.  What Did You Use and How Much? Pt reports that she has had 3 bottles of wine over the past three days.  Do you have any current medical co-morbidities that require immediate attention? No  Clinician description of patient physical appearance/behavior: Pt was casually dressed and adequately groomed. Pt was polite and cooperative. Pt  Pt's speech, movement and thought content were within normal limits. Pt's mood was somewhat sad and a bit anxious but with a flat affect. Pt was oriented x 4  What Do You Feel Would Help You the Most Today? Treatment for  Depression or other mood problem;Stress Management;Medication(s)  If access to [facility] Urgent Care was not available, would you have sought care in the Emergency Department? Yes  Determination of Need Routine (7 days)  Options For Referral Outpatient Therapy;Medication Management    Flowsheet Row ED from 04/19/2023 in [facility] ED to Hosp-Admission (Discharged) from 03/29/2023 in [facility]STE[facility] ED from 09/21/2022 in [facility] at [facility] RISK CATEGORY No Risk No Risk No Risk

## 2023-04-19 NOTE — Discharge Instructions (Addendum)
It is imperative that you follow through with treatment recommendations within 5-7 days from the day of discharge to mitigate further risk to your safety and overall mental well-being. A list of outpatient therapy and psychiatric providers for medication management has been provided below to get you started in finding the right provider for you.            [facility] [address] [place], [place], [address] [phone] phone (Medicare, Private insurance except Tricare, Callahan Mildred, and [facility])  [facility] [address] [place], [place], [address] [address] phone ([address], AmeriHealth [facility] [place], [address], [place], [employer], Friday Health Plans, [address], BCBS Healthy [place], [employer], [address], [place], [place], [place], [place], Tricare, [facility], [employer]co [employer], Eli Lilly and Company)  [employer] [phone] [address] [place], [place], [address] [phone] phone [phone] phone [phone] fax  [facility] [address] [place] [employer] [place], [place], [address] [phone] phone (Call to confirm insurance coverage) Consultation & Support Services     o Drop-In Hours: 1:00 PM to 5:00 PM     o Days: Monday - Thursday  Crisis Services (24/7)   [facility] [address] [place], [place], [address] [phone] phone ([address] [place], [place], [place] Medicaid, [place] and [place], [facility])      [facility] [address] [place], [place], [address] [phone] phone [email or web address]  (to complete the intake form and upload ID and insurance cards)  [facility] [address] [place], [place], [address] [phone] phone ([address], [address], [place], [address] [employer], [place], [place], [place], [facility], [place], and certain Medicaid plans)  [facility] [phone] [address] [place], [place], [address] [phone] phone [phone] fax (Medicaid, Medicare, Self-pay, call about other insurance coverage)  [facility] (age 80+) [address] [place], [place], [address] [phone] hone [phone] fax ([place], [address], [place], [place], [place], [address], [place], [place], [place], [place], certain [employer], [facility], UMR)  [employer] [address] [place], [place], [address] [phone] phone (Medicare, Medicaid, Artemio Aly, call about other insurance coverage)  [facility] [address] [place], [place], [address] [phone] phone [phone] fax (Call [phone] to see what insurance is accepted) Archer Asa, MD specializes in geropsych)  [facility], [facility]  (medication management only) [address] [place], [place], [address] [phone] phone [phone] fax ([address], Medicaid, [place], [place], [place], [place], [place], [place], [place])  [facility] (medication management only) [address] [place], [place], [address] [phone] phone [phone] fax ([address] Medicare, [place], [place], Tricare [place])  [facility] [address] [place], [place], [address] [phone] phone [phone] fax ([address] [place], [place], [place], [place], [facility], [facility] Medicaid/[place] Health Choice)  [facility] [place], [employer]. [address] [place], [place], [address] [phone] phone [phone] fax (Medicare, Medicaid, [facility])  [facility] [address] [place], [place] [address] [phone] phone ([address], [place], [place], [place], [place], Medicare, [place], [facility]) Does genetic testing for medications; does transcranial magnetic stimulation along with basic services)  [facility] [address] [place], [place],  [address] [phone] phone (Call about insurance coverage)  [facility] [address] [place], [place], [address] [phone] phone [phone] fax (Call about insurance coverage)  [facility] [address] [place], [place], [address] [phone] phone [phone] fax (  Call about insurance coverage)  [employer] [phone] [address] [place], [address] [phone] phone (Medicaid, Tricare, [place], [place], [place])  [employer] [address] [place], [place], [address] [phone] phone (Medicaid, Medicare, call about other insurance coverage)  [facility] [address] [place], [place], [address] [phone] phone [phone] fax (Medicaid, Medicare, Tricare, call about other insurance coverage)  [facility] [address], [place], [address] [phone] phone ([address], [address], [place], [place], [place], [place] types - Alliance, Secretary/administrator, Partners, [place], [place] Health Choice, Healthy [place], [place], Shawan Tosh, and Complete)  [facility] [address] [place], [place], [address] [phone] phone Completely online treatment platform Contact: [place] [place] [facility] Specialist [phone] phone [phone] fax ([address] [place], [place], Friday Health Plan, [place], [place], [place], [place], [place], [place])   Discharge recommendations:   Medications: Patient is to take medications as prescribed. The patient or patient's guardian is to contact a medical professional and/or outpatient provider to address any new side effects that develop. The patient or the patient's guardian should update outpatient providers of any new medications and/or medication changes.   Outpatient Follow up: Please review list of outpatient resources for psychiatry and counseling. Please follow up with your primary care provider for all medical related needs.    Therapy: We recommend that patient participate in individual therapy to address mental health concerns.  If symptoms worsen or do not continue to improve or if the patient becomes actively suicidal or homicidal then it is recommended that the patient return to the closest hospital emergency department, the [facility], or call 911 for further evaluation and treatment. National Suicide Prevention Lifeline 1-800-SUICIDE or (435)443-8004.  About 988 988 offers 24/7 access to trained crisis counselors who can help people experiencing mental health-related distress. People can call or text 988 or chat 988lifeline.org for themselves or if they are worried about a loved one who may need crisis support.

## 2023-04-19 NOTE — Telephone Encounter (Signed)
  Chief Complaint: anxiety Symptoms: increased anxiety, overwhelmed, stressed, can't sleep or eat Frequency: 1 week Pertinent Negatives: Patient denies SI Disposition: [] ED /[x] Urgent Care (no appt availability in office) / [] Appointment(In office/virtual)/ []  [facility]/ [] Home Care/ [] Refused Recommended Disposition /[] [facility]/ []  Follow-up with PCP Additional Notes: pt states she has a lot going on in her life right now and has had increase for 1 week in anxiety. Was on medication during pregnancy but unsure of name and that was in 2020. Pt states that she is wanting help and would go check herself into [facility] hospital but has 3 kids and wants to be there for them. Offered appt at [facility] today but that was too far for pt to go so recommended she can go to [facility] today since low wait time and do walk in appt. Pt verbalized understanding and will do that.   Reason for Disposition  MODERATE anxiety (e.g., persistent or frequent anxiety symptoms; interferes with sleep, school, or work)  Answer Assessment - Initial Assessment Questions 1. CONCERN: "Did anything happen that prompted you to call today?"      Increased anxiety  2. ANXIETY SYMPTOMS: "Can you describe how you (your loved one; patient) have been feeling?" (e.g., tense, restless, panicky, anxious, keyed up, overwhelmed, sense of impending doom).      Overwhelmed stressed out  3. ONSET: "How long have you been feeling this way?" (e.g., hours, days, weeks)     1 week  4. SEVERITY: "How would you rate the level of anxiety?" (e.g., 0 - 10; or mild, moderate, severe).     Moderate  5. FUNCTIONAL IMPAIRMENT: "How have these feelings affected your ability to do daily activities?" "Have you had more difficulty than usual doing your normal daily activities?" (e.g., getting better, same, worse; self-care, school, work, interactions)     Getting worse  6. HISTORY: "Have you felt this way before?" "Have you ever been  diagnosed with an anxiety problem in the past?" (e.g., generalized anxiety disorder, panic attacks, PTSD). If Yes, ask: "How was this problem treated?" (e.g., medicines, counseling, etc.)     Relaxation and walking but not helping a lot  7. RISK OF HARM - SUICIDAL IDEATION: "Do you ever have thoughts of hurting or killing yourself?" If Yes, ask:  "Do you have these feelings now?" "Do you have a plan on how you would do this?"     no 8. TREATMENT:  "What has been done so far to treat this anxiety?" (e.g., medicines, relaxation strategies). "What has helped?"     Had anxiety medication  9. TREATMENT - THERAPIST: "Do you have a counselor or therapist? Name?"     no 11. PATIENT SUPPORT: "Who is with you now?" "Who do you live with?" "Do you have family or friends who you can talk to?"        Mom and sister in law  76. OTHER SYMPTOMS: "Do you have any other symptoms?" (e.g., feeling depressed, trouble concentrating, trouble sleeping, trouble breathing, palpitations or fast heartbeat, chest pain, sweating, nausea, or diarrhea)       Decreased appetite no sleeping  Protocols used: Anxiety and Panic Attack-A-AH

## 2023-04-19 NOTE — Telephone Encounter (Signed)
Summary: anxiety   Pt called saying she was having a lot of anxiety and wanted to see michelle asap.  She does not have anything until may 14.  Please advise..[phone]         Attempted to call patient at number provided- x2- constant busy signal- no alternative contact number

## 2023-04-20 NOTE — Telephone Encounter (Signed)
Contacted pt to schedule an appt pt didn't answer lvm  ?

## 2023-06-27 ENCOUNTER — Ambulatory Visit (INDEPENDENT_AMBULATORY_CARE_PROVIDER_SITE_OTHER): Payer: Self-pay | Admitting: *Deleted

## 2023-06-27 NOTE — Telephone Encounter (Signed)
Pt has been called and scheduled an appt for July 22

## 2023-06-27 NOTE — Telephone Encounter (Signed)
Chief Complaint: requesting medication . Worsening anxiety  Symptoms: anxious, overwhelmed, sense of impending doom.  Pt husband in hospital and difficulty "juggling" work and caring for family. Normal activities harder to handle than normal. Trouble sleeping not concentrating.  Frequency: few months  Pertinent Negatives: Patient denies chest pain no difficulty breathing no issues / reports of harming self  Disposition: [] ED /[] Urgent Care (no appt availability in office) / [] Appointment(In office/virtual)/ []  [facility]/ [] Home Care/ [x] Refused Recommended Disposition /[] [facility]/ []  Follow-up with PCP Additional Notes:   No available appt until Aug 1. Recommended mobile bus / UC . Patient declined and reports mobile bus too far away. Requesting medication . Please advise regarding earlier appt. Patient would like a call back.  Provided # for Urgent crisis center .   Reason for Disposition  MODERATE anxiety (e.g., persistent or frequent anxiety symptoms; interferes with sleep, school, or work)  Answer Assessment - Initial Assessment Questions 1. CONCERN: "Did anything happen that prompted you to call today?"      Worsening anxiety  2. ANXIETY SYMPTOMS: "Can you describe how you (your loved one; patient) have been feeling?" (e.g., tense, restless, panicky, anxious, keyed up, overwhelmed, sense of impending doom).      Anxious overwhelmed, sense of impending doom at times 3. ONSET: "How long have you been feeling this way?" (e.g., hours, days, weeks)     Few months  4. SEVERITY: "How would you rate the level of anxiety?" (e.g., 0 - 10; or mild, moderate, severe).     Worse at times 5. FUNCTIONAL IMPAIRMENT: "How have these feelings affected your ability to do daily activities?" "Have you had more difficulty than usual doing your normal daily activities?" (e.g., getting better, same, worse; self-care, school, work, interactions)     Can do normal activities but  becoming harder than usual  6. HISTORY: "Have you felt this way before?" "Have you ever been diagnosed with an anxiety problem in the past?" (e.g., generalized anxiety disorder, panic attacks, PTSD). If Yes, ask: "How was this problem treated?" (e.g., medicines, counseling, etc.)     Yes talks with nurse at [facility]  7. RISK OF HARM - SUICIDAL IDEATION: "Do you ever have thoughts of hurting or killing yourself?" If Yes, ask:  "Do you have these feelings now?" "Do you have a plan on how you would do this?"     Na  8. TREATMENT:  "What has been done so far to treat this anxiety?" (e.g., medicines, relaxation strategies). "What has helped?"     Talks with nurse from work on Virtual visits  9. TREATMENT - THERAPIST: "Do you have a counselor or therapist? Name?"     Nurse at [facility]. Through work virtual visit through work  10. POTENTIAL TRIGGERS: "Do you drink caffeinated beverages (e.g., coffee, colas, teas), and how much daily?" "Do you drink alcohol or use any drugs?" "Have you started any new medicines recently?"       Na  11. PATIENT SUPPORT: "Who is with you now?" "Who do you live with?" "Do you have family or friends who you can talk to?"        Yes  12. OTHER SYMPTOMS: "Do you have any other symptoms?" (e.g., feeling depressed, trouble concentrating, trouble sleeping, trouble breathing, palpitations or fast heartbeat, chest pain, sweating, nausea, or diarrhea)       Panic attacks at times last one last week. Trouble sleeping and concentrating , requesting medication  13. PREGNANCY: "Is there  any chance you are pregnant?" "When was your last menstrual period?"       na  Protocols used: Anxiety and Panic Attack-A-AH

## 2023-07-09 ENCOUNTER — Ambulatory Visit (INDEPENDENT_AMBULATORY_CARE_PROVIDER_SITE_OTHER): Payer: 59

## 2023-10-05 ENCOUNTER — Telehealth: Payer: 59 | Admitting: Physician Assistant

## 2023-10-05 DIAGNOSIS — R3989 Other symptoms and signs involving the genitourinary system: Secondary | ICD-10-CM | POA: Diagnosis not present

## 2023-10-05 MED ORDER — NITROFURANTOIN MONOHYD MACRO 100 MG PO CAPS
100.0000 mg | ORAL_CAPSULE | Freq: Two times a day (BID) | ORAL | 0 refills | Status: DC
Start: 2023-10-05 — End: 2024-04-19

## 2023-10-05 NOTE — Progress Notes (Signed)
Virtual Visit Consent   Susan Clements, you are scheduled for a virtual visit with a [facility] provider today. Just as with appointments in the office, your consent must be obtained to participate. Your consent will be active for this visit and any virtual visit you may have with one of our providers in the next 365 days. If you have a MyChart account, a copy of this consent can be sent to you electronically.  As this is a virtual visit, video technology does not allow for your provider to perform a traditional examination. This may limit your provider's ability to fully assess your condition. If your provider identifies any concerns that need to be evaluated in person or the need to arrange testing (such as labs, EKG, etc.), we will make arrangements to do so. Although advances in technology are sophisticated, we cannot ensure that it will always work on either your end or our end. If the connection with a video visit is poor, the visit may have to be switched to a telephone visit. With either a video or telephone visit, we are not always able to ensure that we have a secure connection.  By engaging in this virtual visit, you consent to the provision of healthcare and authorize for your insurance to be billed (if applicable) for the services provided during this visit. Depending on your insurance coverage, you may receive a charge related to this service.  I need to obtain your verbal consent now. Are you willing to proceed with your visit today? Susan Clements has provided verbal consent on 10/05/2023 for a virtual visit (video or telephone). Margaretann Loveless, PA-C  Date: 10/05/2023 4:30 PM  Virtual Visit via Video Note   IMargaretann Loveless, connected with  Susan Clements  ([id number], [birth year]) on 10/05/23 at  4:30 PM EDT by a video-enabled telemedicine application and verified that I am speaking with the correct person using two identifiers.  Location: Patient: Virtual Visit  Location Patient: Home Provider: Virtual Visit Location Provider: Home Office   I discussed the limitations of evaluation and management by telemedicine and the availability of in person appointments. The patient expressed understanding and agreed to proceed.    History of Present Illness: Susan Clements is a 32 y.o. who identifies as a female who was assigned female at birth, and is being seen today for possible UTI.  HPI: Urinary Tract Infection  This is a new problem. The current episode started in the past 7 days (past couple of days). The problem occurs every urination. The problem has been gradually worsening. The quality of the pain is described as aching (throbbing). The pain is mild. There has been no fever. Associated symptoms include frequency, hesitancy and urgency. Pertinent negatives include no chills, discharge, flank pain, hematuria, nausea or vomiting. She has tried acetaminophen and increased fluids (cranberry juice) for the symptoms. The treatment provided no relief.     Problems:  Patient Active Problem List   Diagnosis Date Noted   Appendicitis 03/29/2023   Encounter for postpartum visit 06/13/2021   [redacted] weeks gestation of pregnancy 05/02/2021   Vaginal delivery 05/01/2021   IUD (intrauterine device) in place 05/01/2021   Shoulder dystocia, delivered 05/01/2021   Encounter for elective induction of labor 04/30/2021   BMI 45.0-49.9, adult (HCC) 04/05/2021   Gestational diabetes 02/20/2021   Heartburn during pregnancy, antepartum 12/31/2020   Anxiety 12/31/2020   Major depressive disorder, single episode, severe (HCC)    Obesity during pregnancy 11/05/2020   Supervision  of high risk pregnancy, antepartum 10/29/2020    Allergies:  Allergies  Allergen Reactions   Shellfish-Derived Products Anaphylaxis   Nsaids    Medications:  Current Outpatient Medications:    nitrofurantoin, macrocrystal-monohydrate, (MACROBID) 100 MG capsule, Take 1 capsule (100 mg total)  by mouth 2 (two) times daily., Disp: 10 capsule, Rfl: 0   albuterol (VENTOLIN HFA) 108 (90 Base) MCG/ACT inhaler, Inhale 2 puffs into the lungs every 4 (four) hours as needed for wheezing or shortness of breath. (Patient not taking: Reported on 03/30/2023), Disp: 1 each, Rfl: 0   Ascorbic Acid (VITAMIN C PO), Take 1 tablet by mouth daily., Disp: , Rfl:    Norethindrone Acetate-Ethinyl Estrad-FE (LOESTRIN 24 FE) 1-20 MG-MCG(24) tablet, Take 1 tablet by mouth daily., Disp: 28 tablet, Rfl: 11   traMADol (ULTRAM) 50 MG tablet, Take 1-2 tablets (50-100 mg total) by mouth every 6 (six) hours as needed for moderate pain or severe pain., Disp: 20 tablet, Rfl: 0   VITAMIN D PO, Take 1 tablet by mouth daily., Disp: , Rfl:   Observations/Objective: Patient is well-developed, well-nourished in no acute distress.  Resting comfortably at home.  Head is normocephalic, atraumatic.  No labored breathing.  Speech is clear and coherent with logical content.  Patient is alert and oriented at baseline.    Assessment and Plan: 1. Suspected UTI - nitrofurantoin, macrocrystal-monohydrate, (MACROBID) 100 MG capsule; Take 1 capsule (100 mg total) by mouth 2 (two) times daily.  Dispense: 10 capsule; Refill: 0  - Worsening symptoms.  - Will treat empirically with Macrobid - May use AZO for bladder spasms - Continue to push fluids.  - Seek in person evaluation for urine culture if symptoms do not improve or if they worsen.    Follow Up Instructions: I discussed the assessment and treatment plan with the patient. The patient was provided an opportunity to ask questions and all were answered. The patient agreed with the plan and demonstrated an understanding of the instructions.  A copy of instructions were sent to the patient via MyChart unless otherwise noted below.    The patient was advised to call back or seek an in-person evaluation if the symptoms worsen or if the condition fails to improve as anticipated.     Margaretann Loveless, PA-C

## 2023-10-05 NOTE — Patient Instructions (Signed)
Susan Clements, thank you for joining Margaretann Loveless, PA-C for today's virtual visit.  While this provider is not your primary care provider (PCP), if your PCP is located in our provider database this encounter information will be shared with them immediately following your visit.   A [facility] MyChart account gives you access to today's visit and all your visits, tests, and labs performed at [facility] " click here if you don't have a [facility] MyChart account or go to [email or web address]  Consent: (Patient) Susan Clements provided verbal consent for this virtual visit at the beginning of the encounter.  Current Medications:  Current Outpatient Medications:    nitrofurantoin, macrocrystal-monohydrate, (MACROBID) 100 MG capsule, Take 1 capsule (100 mg total) by mouth 2 (two) times daily., Disp: 10 capsule, Rfl: 0   albuterol (VENTOLIN HFA) 108 (90 Base) MCG/ACT inhaler, Inhale 2 puffs into the lungs every 4 (four) hours as needed for wheezing or shortness of breath. (Patient not taking: Reported on 03/30/2023), Disp: 1 each, Rfl: 0   Ascorbic Acid (VITAMIN C PO), Take 1 tablet by mouth daily., Disp: , Rfl:    Norethindrone Acetate-Ethinyl Estrad-FE (LOESTRIN 24 FE) 1-20 MG-MCG(24) tablet, Take 1 tablet by mouth daily., Disp: 28 tablet, Rfl: 11   traMADol (ULTRAM) 50 MG tablet, Take 1-2 tablets (50-100 mg total) by mouth every 6 (six) hours as needed for moderate pain or severe pain., Disp: 20 tablet, Rfl: 0   VITAMIN D PO, Take 1 tablet by mouth daily., Disp: , Rfl:    Medications ordered in this encounter:  Meds ordered this encounter  Medications   nitrofurantoin, macrocrystal-monohydrate, (MACROBID) 100 MG capsule    Sig: Take 1 capsule (100 mg total) by mouth 2 (two) times daily.    Dispense:  10 capsule    Refill:  0    Order Specific Question:   Supervising Provider    Answer:   Merrilee Jansky [id number]     *If you need refills on other  medications prior to your next appointment, please contact your pharmacy*  Follow-Up: Call back or seek an in-person evaluation if the symptoms worsen or if the condition fails to improve as anticipated.  [facility] [phone]  Other Instructions Urinary Tract Infection, Adult  A urinary tract infection (UTI) is an infection of any part of the urinary tract. The urinary tract includes the kidneys, ureters, bladder, and urethra. These organs make, store, and get rid of urine in the body. An upper UTI affects the ureters and kidneys. A lower UTI affects the bladder and urethra. What are the causes? Most urinary tract infections are caused by bacteria in your genital area around your urethra, where urine leaves your body. These bacteria grow and cause inflammation of your urinary tract. What increases the risk? You are more likely to develop this condition if: You have a urinary catheter that stays in place. You are not able to control when you urinate or have a bowel movement (incontinence). You are female and you: Use a spermicide or diaphragm for birth control. Have low estrogen levels. Are pregnant. You have certain genes that increase your risk. You are sexually active. You take antibiotic medicines. You have a condition that causes your flow of urine to slow down, such as: An enlarged prostate, if you are female. Blockage in your urethra. A kidney stone. A nerve condition that affects your bladder control (neurogenic bladder). Not getting enough to drink, or not urinating often. You have  certain medical conditions, such as: Diabetes. A weak disease-fighting system (immunesystem). Sickle cell disease. Gout. Spinal cord injury. What are the signs or symptoms? Symptoms of this condition include: Needing to urinate right away (urgency). Frequent urination. This may include small amounts of urine each time you urinate. Pain or burning with urination. Blood in  the urine. Urine that smells bad or unusual. Trouble urinating. Cloudy urine. Vaginal discharge, if you are female. Pain in the abdomen or the lower back. You may also have: Vomiting or a decreased appetite. Confusion. Irritability or tiredness. A fever or chills. Diarrhea. The first symptom in older adults may be confusion. In some cases, they may not have any symptoms until the infection has worsened. How is this diagnosed? This condition is diagnosed based on your medical history and a physical exam. You may also have other tests, including: Urine tests. Blood tests. Tests for STIs (sexually transmitted infections). If you have had more than one UTI, a cystoscopy or imaging studies may be done to determine the cause of the infections. How is this treated? Treatment for this condition includes: Antibiotic medicine. Over-the-counter medicines to treat discomfort. Drinking enough water to stay hydrated. If you have frequent infections or have other conditions such as a kidney stone, you may need to see a health care provider who specializes in the urinary tract (urologist). In rare cases, urinary tract infections can cause sepsis. Sepsis is a life-threatening condition that occurs when the body responds to an infection. Sepsis is treated in the hospital with IV antibiotics, fluids, and other medicines. Follow these instructions at home:  Medicines Take over-the-counter and prescription medicines only as told by your health care provider. If you were prescribed an antibiotic medicine, take it as told by your health care provider. Do not stop using the antibiotic even if you start to feel better. General instructions Make sure you: Empty your bladder often and completely. Do not hold urine for long periods of time. Empty your bladder after sex. Wipe from front to back after urinating or having a bowel movement if you are female. Use each tissue only one time when you wipe. Drink  enough fluid to keep your urine pale yellow. Keep all follow-up visits. This is important. Contact a health care provider if: Your symptoms do not get better after 1-2 days. Your symptoms go away and then return. Get help right away if: You have severe pain in your back or your lower abdomen. You have a fever or chills. You have nausea or vomiting. Summary A urinary tract infection (UTI) is an infection of any part of the urinary tract, which includes the kidneys, ureters, bladder, and urethra. Most urinary tract infections are caused by bacteria in your genital area. Treatment for this condition often includes antibiotic medicines. If you were prescribed an antibiotic medicine, take it as told by your health care provider. Do not stop using the antibiotic even if you start to feel better. Keep all follow-up visits. This is important. This information is not intended to replace advice given to you by your health care provider. Make sure you discuss any questions you have with your health care provider. Document Revised: 07/11/2020 Document Reviewed: 07/16/2020 Elsevier Patient Education  2024 Elsevier Inc.    If you have been instructed to have an in-person evaluation today at a local Urgent Care facility, please use the link below. It will take you to a list of all of our available [facility], including address, phone number  and hours of operation. Please do not delay care.  [facility]  If you or a family member do not have a primary care provider, use the link below to schedule a visit and establish care. When you choose a [facility] primary care physician or advanced practice provider, you gain a long-term partner in health. Find a Primary Care Provider  Learn more about [facility]'s in-office and virtual care options: [facility] - Get Care Now

## 2023-12-26 ENCOUNTER — Other Ambulatory Visit: Payer: Self-pay | Admitting: *Deleted

## 2023-12-26 DIAGNOSIS — Z30011 Encounter for initial prescription of contraceptive pills: Secondary | ICD-10-CM

## 2023-12-26 MED ORDER — NORETHIN ACE-ETH ESTRAD-FE 1-20 MG-MCG(24) PO TABS
1.0000 | ORAL_TABLET | Freq: Every day | ORAL | 1 refills | Status: DC
Start: 2023-12-26 — End: 2024-01-24

## 2023-12-26 NOTE — Progress Notes (Signed)
 Annual due 12/10/23. RX OCP refilled per protocol (<18 mos since annual). [facility] msg sent reminding pt to schedule annual exam.

## 2023-12-29 ENCOUNTER — Telehealth: Payer: 59 | Admitting: Nurse Practitioner

## 2024-01-21 NOTE — Progress Notes (Signed)
 ANNUAL EXAM Patient name: Susan Clements MRN [id number]  Date of birth: 1991-01-19 Chief Complaint:   No chief complaint on file.  History of Present Illness:   Susan Clements is a 33 y.o. [phone] female being seen today for a routine annual exam.   Current complaints: Periodic urinary frequency for several months.   No LMP recorded. (Menstrual status: IUD).  The pregnancy intention screening data noted above was reviewed. Potential methods of contraception were discussed. The patient elected to proceed with No data recorded.   Last pap 11/05/20-NILM. H/O abnormal pap: no Last mammogram: Not yet indicated d/t age. Results were: N/A. Family h/o breast cancer: no Last colonoscopy: Not yet indicated. Results were: N/A. Family h/o colorectal cancer: no STI testing: Patient consents Contraception use: Uses emergency contraception once monthly     09/14/2022    9:01 AM 06/13/2021   10:52 AM 03/14/2021    8:47 AM 12/05/2020    8:01 AM 10/29/2020   10:39 AM  Depression screen PHQ 2/9  Decreased Interest 0 0 0  2  Down, Depressed, Hopeless 1 0 0  1  PHQ - 2 Score 1 0 0  3  Altered sleeping 0 1   1  Tired, decreased energy 2 1   1   Change in appetite 3 0   0  Feeling bad or failure about yourself  0 0   0  Trouble concentrating 0 0   0  Moving slowly or fidgety/restless 0 0   0  Suicidal thoughts 0 0   0  PHQ-9 Score 6 2   5      Information is confidential and restricted. Go to Review Flowsheets to unlock data.        09/14/2022    9:01 AM 06/13/2021   10:53 AM 10/29/2020   10:41 AM  GAD 7 : Generalized Anxiety Score  Nervous, Anxious, on Edge 1 1 3   Control/stop worrying 1 0 3  Worry too much - different things 1 0 3  Trouble relaxing 1 0 3  Restless 0 1 0  Easily annoyed or irritable 0 0 1  Afraid - awful might happen 0 0 1  Total GAD 7 Score 4 2 14      Review of Systems:   Pertinent items are noted in HPI Denies any headaches, blurred vision, fatigue,  shortness of breath, chest pain, abdominal pain, abnormal vaginal discharge/itching/odor/irritation, problems with periods, bowel movements, urination, or intercourse unless otherwise stated above. Pertinent History Reviewed:  Reviewed past medical,surgical, social and family history.  Reviewed problem list, medications and allergies. Physical Assessment:  There were no vitals filed for this visit.There is no height or weight on file to calculate BMI.        Physical Examination:   General appearance - well appearing, and in no distress  Mental status - alert, oriented to person, place, and time  Psych:  She has a normal mood and affect  Skin - warm and dry, normal color, no suspicious lesions noted  Chest - effort normal, all lung fields clear to auscultation bilaterally  Heart - normal rate and regular rhythm  Neck:  midline trachea, no thyromegaly or nodules  Breasts - breasts appear normal, no suspicious masses, no skin or nipple changes or  axillary nodes  Abdomen - soft, nontender, nondistended, no masses or organomegaly  Pelvic - VULVA: normal appearing vulva with no masses, tenderness or lesions  VAGINA: normal appearing vagina with normal color and discharge, no lesions  CERVIX:  normal appearing cervix without discharge or lesions, no CMT  Thin prep pap is done with HR HPV cotesting  UTERUS: uterus is felt to be normal size, shape, consistency and nontender   ADNEXA: No adnexal masses or tenderness noted.  Extremities:  No swelling or varicosities noted  Chaperone present for exam  No results found for this or any previous visit (from the past 24 hours).  Assessment & Plan:  1. Encounter for annual routine gynecological examination (Primary) - Cervical cancer screening: Discussed guidelines. Pap with HPV completed today. - GC/CT: Accepts - Birth Control: Declines - Breast Health: Encouraged self breast awareness/SBE. Teaching provided.    Mammogram: @ 35yo, or sooner if  problems - Colonoscopy: @ 35yo, or sooner if problems - F/U 12 months and prn   - Cytology - PAP - Cervicovaginal ancillary only( [facility])  2. Urinary frequency - POCT Urinalysis Dipstick - Urine Culture   No orders of the defined types were placed in this encounter.   Meds: No orders of the defined types were placed in this encounter.   Follow-up: No follow-ups on file.  Jeremi Losito E Francies Inch, PA-C 01/21/2024 1:22 PM

## 2024-01-23 ENCOUNTER — Ambulatory Visit (INDEPENDENT_AMBULATORY_CARE_PROVIDER_SITE_OTHER): Payer: 59 | Admitting: Primary Care

## 2024-01-23 ENCOUNTER — Ambulatory Visit: Payer: 59 | Admitting: Physician Assistant

## 2024-01-23 ENCOUNTER — Other Ambulatory Visit (HOSPITAL_COMMUNITY)
Admission: RE | Admit: 2024-01-23 | Discharge: 2024-01-23 | Disposition: A | Discharge status: Home / Self Care | Payer: 59 | Source: Ambulatory Visit | Attending: Physician Assistant | Admitting: Physician Assistant

## 2024-01-23 ENCOUNTER — Encounter: Payer: Self-pay | Admitting: Physician Assistant

## 2024-01-23 VITALS — BP 100/67 | HR 83 | Wt 238.9 lb

## 2024-01-23 VITALS — BP 112/82 | HR 82 | Resp 16 | Ht 64.0 in | Wt 237.2 lb

## 2024-01-23 DIAGNOSIS — N920 Excessive and frequent menstruation with regular cycle: Secondary | ICD-10-CM

## 2024-01-23 DIAGNOSIS — R350 Frequency of micturition: Secondary | ICD-10-CM | POA: Diagnosis not present

## 2024-01-23 DIAGNOSIS — Z01419 Encounter for gynecological examination (general) (routine) without abnormal findings: Secondary | ICD-10-CM | POA: Insufficient documentation

## 2024-01-23 DIAGNOSIS — E782 Mixed hyperlipidemia: Secondary | ICD-10-CM

## 2024-01-23 DIAGNOSIS — Z6841 Body Mass Index (BMI) 40.0 and over, adult: Secondary | ICD-10-CM

## 2024-01-23 DIAGNOSIS — E66813 Obesity, class 3: Secondary | ICD-10-CM

## 2024-01-23 DIAGNOSIS — Z30011 Encounter for initial prescription of contraceptive pills: Secondary | ICD-10-CM | POA: Diagnosis not present

## 2024-01-23 DIAGNOSIS — Z0001 Encounter for general adult medical examination with abnormal findings: Secondary | ICD-10-CM | POA: Diagnosis not present

## 2024-01-23 DIAGNOSIS — O24410 Gestational diabetes mellitus in pregnancy, diet controlled: Secondary | ICD-10-CM

## 2024-01-23 LAB — POCT URINALYSIS DIPSTICK
Bilirubin, UA: NEGATIVE
Glucose, UA: NEGATIVE
Ketones, UA: NEGATIVE
Leukocytes, UA: NEGATIVE
Nitrite, UA: NEGATIVE
Protein, UA: NEGATIVE
Spec Grav, UA: 1.02 (ref 1.010–1.025)
Urobilinogen, UA: 0.2 U/dL
pH, UA: 6 (ref 5.0–8.0)

## 2024-01-23 NOTE — Progress Notes (Signed)
 [facility]  Susan Clements is a 33 y.o. female presents to office today for annual physical exam examination.    Concerns today include: 1. none  Health Maintenance  Topic Date Due   Pneumococcal Vaccine 74-88 Years old (1 of 2 - PCV) Never done   INFLUENZA VACCINE  Never done   COVID-19 Vaccine (1 - 2024-25 season) Never done   Cervical Cancer Screening (HPV/Pap Cotest)  11/05/2025   DTaP/Tdap/Td (2 - Td or Tdap) 02/19/2031   Hepatitis C Screening  Completed   HIV Screening  Completed   HPV VACCINES  Aged Out     Past Medical History:  Diagnosis Date   Asthma    Gestational diabetes    Social History   Socioeconomic History   Marital status: Married    Spouse name: Not on file   Number of children: Not on file   Years of education: Not on file   Highest education level: Associate degree: occupational, scientist, product/process development, or vocational program  Occupational History   Not on file  Tobacco Use   Smoking status: Former    Current packs/day: 0.00    Types: Cigars, Cigarettes    Quit date: 09/10/2020    Years since quitting: 3.3   Smokeless tobacco: Current   Tobacco comments:    on patch  Vaping Use   Vaping status: Never Used  Substance and Sexual Activity   Alcohol use: Yes    Comment: rarely   Drug use: Not Currently   Sexual activity: Yes    Partners: Male    Birth control/protection: OCP  Other Topics Concern   Not on file  Social History Narrative   Not on file   Social Drivers of Health   Financial Resource Strain: Low Risk  (01/23/2024)   Overall Financial Resource Strain (CARDIA)    Difficulty of Paying Living Expenses: Not hard at all  Food Insecurity: No Food Insecurity (01/23/2024)   Hunger Vital Sign    Worried About Running Out of Food in the Last Year: Never true    Ran Out of Food in the Last Year: Never true  Transportation Needs: No Transportation Needs (01/23/2024)   PRAPARE - Administrator, Civil Service (Medical):  No    Lack of Transportation (Non-Medical): No  Physical Activity: Sufficiently Active (01/23/2024)   Exercise Vital Sign    Days of Exercise per Week: 3 days    Minutes of Exercise per Session: 60 min  Stress: Stress Concern Present (01/23/2024)   [employer] of Occupational Health - Occupational Stress Questionnaire    Feeling of Stress : To some extent  Social Connections: Moderately Isolated (01/23/2024)   Social Connection and Isolation Panel [NHANES]    Frequency of Communication with Friends and Family: More than three times a week    Frequency of Social Gatherings with Friends and Family: Once a week    Attends Religious Services: Never    Database Administrator or Organizations: No    Attends Engineer, Structural: Not on file    Marital Status: Married  Catering Manager Violence: Not At Risk (03/30/2023)   Humiliation, Afraid, Rape, and Kick questionnaire    Fear of Current or Ex-Partner: No    Emotionally Abused: No    Physically Abused: No    Sexually Abused: No   Past Surgical History:  Procedure Laterality Date   LAPAROSCOPIC APPENDECTOMY N/A 03/30/2023   Procedure: APPENDECTOMY LAPAROSCOPIC;  Surgeon: Sheldon Standing, MD;  Location:  [facility];  Service: General;  Laterality: N/A;   NO PAST SURGERIES     Family History  Problem Relation Age of Onset   Healthy Mother    Diabetes Father    Throat cancer Maternal Grandmother    Hypertension Maternal Grandmother    Diabetes Maternal Grandmother    Heart failure Maternal Grandmother    Hypertension Paternal Grandmother     Current Outpatient Medications:    albuterol  (VENTOLIN  HFA) 108 (90 Base) MCG/ACT inhaler, Inhale 2 puffs into the lungs every 4 (four) hours as needed for wheezing or shortness of breath. (Patient not taking: Reported on 03/30/2023), Disp: 1 each, Rfl: 0   Ascorbic Acid  (VITAMIN C  PO), Take 1 tablet by mouth daily., Disp: , Rfl:    nitrofurantoin , macrocrystal-monohydrate, (MACROBID ) 100 MG  capsule, Take 1 capsule (100 mg total) by mouth 2 (two) times daily., Disp: 10 capsule, Rfl: 0   Norethindrone Acetate-Ethinyl Estrad-FE (LOESTRIN 24 FE) 1-20 MG-MCG(24) tablet, Take 1 tablet by mouth daily., Disp: 28 tablet, Rfl: 1   traMADol  (ULTRAM ) 50 MG tablet, Take 1-2 tablets (50-100 mg total) by mouth every 6 (six) hours as needed for moderate pain or severe pain., Disp: 20 tablet, Rfl: 0   VITAMIN D PO, Take 1 tablet by mouth daily., Disp: , Rfl:  Outpatient Encounter Medications as of 01/23/2024  Medication Sig   albuterol  (VENTOLIN  HFA) 108 (90 Base) MCG/ACT inhaler Inhale 2 puffs into the lungs every 4 (four) hours as needed for wheezing or shortness of breath. (Patient not taking: Reported on 03/30/2023)   Ascorbic Acid  (VITAMIN C  PO) Take 1 tablet by mouth daily.   nitrofurantoin , macrocrystal-monohydrate, (MACROBID ) 100 MG capsule Take 1 capsule (100 mg total) by mouth 2 (two) times daily.   Norethindrone Acetate-Ethinyl Estrad-FE (LOESTRIN 24 FE) 1-20 MG-MCG(24) tablet Take 1 tablet by mouth daily.   traMADol  (ULTRAM ) 50 MG tablet Take 1-2 tablets (50-100 mg total) by mouth every 6 (six) hours as needed for moderate pain or severe pain.   VITAMIN D PO Take 1 tablet by mouth daily.   No facility-administered encounter medications on file as of 01/23/2024.    Allergies  Allergen Reactions   Shellfish-Derived Products Anaphylaxis   Nsaids    BP 112/82   Pulse 82   Resp 16   Ht 5' 4 (1.626 m)   Wt 237 lb 3.2 oz (107.6 kg)   LMP 01/09/2024 (Approximate)   SpO2 100%   BMI 40.72 kg/m   ROS: Review of Systems A comprehensive review of systems was negative.    Physical exam Physical exam: General: Vital signs reviewed.  Patient is well-developed and well-nourished, morbid obese in no acute distress and cooperative with exam. Head: Normocephalic and atraumatic. Eyes: EOMI, conjunctivae normal, no scleral icterus. Neck: Supple, trachea midline, normal ROM, no JVD, masses,  thyromegaly, or carotid bruit present. Cardiovascular: RRR, S1 normal, S2 normal, no murmurs, gallops, or rubs. Pulmonary/Chest: Clear to auscultation bilaterally, no wheezes, rales, or rhonchi. Abdominal: Soft, non-tender, non-distended, BS +, no masses, organomegaly, or guarding present. Musculoskeletal: No joint deformities, erythema, or stiffness, ROM full and nontender. Extremities: No lower extremity edema bilaterally,  pulses symmetric and intact bilaterally. No cyanosis or clubbing. Neurological: A&O x3, Strength is normal Skin: Warm, dry and intact. No rashes or erythema. Psychiatric: Normal mood and affect. speech and behavior is normal. Cognition and memory are normal.       Assessment/ Plan: Susan Clements here for annual physical exam.  Susan Clements was seen  today for annual exam.  Diagnoses and all orders for this visit:  Mixed hyperlipidemia -     Lipid Panel  Diet controlled gestational diabetes mellitus (GDM) in third trimester -     Hemoglobin A1c  Menorrhagia with regular cycle -     CMP14+EGFR -     CBC with Differential    Counseled on healthy lifestyle choices, including diet (rich in fruits, vegetables and lean meats and low in salt and simple carbohydrates) and exercise (at least 30 minutes of moderate physical activity daily).  Patient to follow up in 1 year for annual exam or sooner if needed.  The above assessment and management plan was discussed with the patient. The patient verbalized understanding of and has agreed to the management plan. Patient is aware to call the clinic if symptoms persist or worsen. Patient is aware when to return to the clinic for a follow-up visit. Patient educated on when it is appropriate to go to the emergency department.   This note has been created with Education officer, environmental. Any transcriptional errors are unintentional.   Rosaline SHAUNNA Bohr, NP 01/23/2024, 11:04 AM

## 2024-01-23 NOTE — Progress Notes (Signed)
 Pt presents for GYN visit. Pap today.

## 2024-01-24 LAB — HEMOGLOBIN A1C
Est. average glucose Bld gHb Est-mCnc: 100 mg/dL
Hgb A1c MFr Bld: 5.1 % (ref 4.8–5.6)

## 2024-01-24 LAB — CERVICOVAGINAL ANCILLARY ONLY
Bacterial Vaginitis (gardnerella): NEGATIVE
Candida Glabrata: NEGATIVE
Candida Vaginitis: NEGATIVE
Chlamydia: NEGATIVE
Comment: NEGATIVE
Comment: NEGATIVE
Comment: NEGATIVE
Comment: NEGATIVE
Comment: NEGATIVE
Comment: NORMAL
Neisseria Gonorrhea: NEGATIVE
Trichomonas: NEGATIVE

## 2024-01-24 LAB — CBC WITH DIFFERENTIAL/PLATELET
Basophils Absolute: 0 10*3/uL (ref 0.0–0.2)
Basos: 1 %
EOS (ABSOLUTE): 0.1 10*3/uL (ref 0.0–0.4)
Eos: 3 %
Hematocrit: 39.3 % (ref 34.0–46.6)
Hemoglobin: 12.7 g/dL (ref 11.1–15.9)
Immature Grans (Abs): 0 10*3/uL (ref 0.0–0.1)
Immature Granulocytes: 0 %
Lymphocytes Absolute: 2.5 10*3/uL (ref 0.7–3.1)
Lymphs: 56 %
MCH: 30.1 pg (ref 26.6–33.0)
MCHC: 32.3 g/dL (ref 31.5–35.7)
MCV: 93 fL (ref 79–97)
Monocytes Absolute: 0.3 10*3/uL (ref 0.1–0.9)
Monocytes: 7 %
Neutrophils Absolute: 1.5 10*3/uL (ref 1.4–7.0)
Neutrophils: 33 %
Platelets: 387 10*3/uL (ref 150–450)
RBC: 4.22 x10E6/uL (ref 3.77–5.28)
RDW: 10.9 % — ABNORMAL LOW (ref 11.7–15.4)
WBC: 4.6 10*3/uL (ref 3.4–10.8)

## 2024-01-24 LAB — LIPID PANEL
Chol/HDL Ratio: 5 {ratio} — ABNORMAL HIGH (ref 0.0–4.4)
Cholesterol, Total: 205 mg/dL — ABNORMAL HIGH (ref 100–199)
HDL: 41 mg/dL (ref >39)
LDL Chol Calc (NIH): 148 mg/dL — ABNORMAL HIGH (ref 0–99)
Triglycerides: 87 mg/dL (ref 0–149)
VLDL Cholesterol Cal: 16 mg/dL (ref 5–40)

## 2024-01-24 LAB — CMP14+EGFR
ALT: 16 [IU]/L (ref 0–32)
AST: 14 [IU]/L (ref 0–40)
Albumin: 4.3 g/dL (ref 3.9–4.9)
Alkaline Phosphatase: 61 [IU]/L (ref 44–121)
BUN/Creatinine Ratio: 17 (ref 9–23)
BUN: 10 mg/dL (ref 6–20)
Bilirubin Total: 0.8 mg/dL (ref 0.0–1.2)
CO2: 22 mmol/L (ref 20–29)
Calcium: 10.3 mg/dL — ABNORMAL HIGH (ref 8.7–10.2)
Chloride: 102 mmol/L (ref 96–106)
Creatinine, Ser: 0.59 mg/dL (ref 0.57–1.00)
Globulin, Total: 2.3 g/dL (ref 1.5–4.5)
Glucose: 81 mg/dL (ref 70–99)
Potassium: 4.2 mmol/L (ref 3.5–5.2)
Sodium: 137 mmol/L (ref 134–144)
Total Protein: 6.6 g/dL (ref 6.0–8.5)
eGFR: 121 mL/min/{1.73_m2} (ref >59)

## 2024-01-24 MED ORDER — NORETHIN ACE-ETH ESTRAD-FE 1-20 MG-MCG(24) PO TABS
1.0000 | ORAL_TABLET | Freq: Every day | ORAL | 11 refills | Status: AC
Start: 2024-01-24 — End: ?

## 2024-01-24 NOTE — Addendum Note (Signed)
 Addended by: Ina Manas on: 01/24/2024 11:02 AM   Modules accepted: Orders

## 2024-01-25 ENCOUNTER — Encounter: Payer: Self-pay | Admitting: Physician Assistant

## 2024-01-28 ENCOUNTER — Other Ambulatory Visit: Payer: Self-pay | Admitting: Physician Assistant

## 2024-01-28 LAB — URINE CULTURE

## 2024-01-28 LAB — CYTOLOGY - PAP
Comment: NEGATIVE
Diagnosis: NEGATIVE
High risk HPV: NEGATIVE

## 2024-01-28 MED ORDER — CEPHALEXIN 500 MG PO CAPS: 500.0000 mg | ORAL_CAPSULE | Freq: Two times a day (BID) | ORAL | 0 refills | Status: AC

## 2024-03-14 ENCOUNTER — Emergency Department (HOSPITAL_COMMUNITY)

## 2024-03-14 ENCOUNTER — Other Ambulatory Visit: Payer: Self-pay

## 2024-03-14 ENCOUNTER — Encounter (HOSPITAL_COMMUNITY): Payer: Self-pay | Admitting: *Deleted

## 2024-03-14 ENCOUNTER — Emergency Department (HOSPITAL_COMMUNITY)
Admission: EM | Admit: 2024-03-14 | Discharge: 2024-03-14 | Disposition: A | Discharge status: Home / Self Care | Attending: Emergency Medicine | Admitting: Emergency Medicine

## 2024-03-14 DIAGNOSIS — F419 Anxiety disorder, unspecified: Secondary | ICD-10-CM | POA: Diagnosis not present

## 2024-03-14 DIAGNOSIS — R002 Palpitations: Secondary | ICD-10-CM | POA: Insufficient documentation

## 2024-03-14 DIAGNOSIS — R0789 Other chest pain: Secondary | ICD-10-CM | POA: Insufficient documentation

## 2024-03-14 DIAGNOSIS — R000 Tachycardia, unspecified: Secondary | ICD-10-CM | POA: Insufficient documentation

## 2024-03-14 LAB — TROPONIN I (HIGH SENSITIVITY)
Troponin I (High Sensitivity): 3 ng/L (ref <18)
Troponin I (High Sensitivity): 3 ng/L (ref <18)

## 2024-03-14 LAB — CBC
HCT: 38 % (ref 36.0–46.0)
Hemoglobin: 12.3 g/dL (ref 12.0–15.0)
MCH: 30.8 pg (ref 26.0–34.0)
MCHC: 32.4 g/dL (ref 30.0–36.0)
MCV: 95.2 fL (ref 80.0–100.0)
Platelets: 358 10*3/uL (ref 150–400)
RBC: 3.99 MIL/uL (ref 3.87–5.11)
RDW: 11.4 % — ABNORMAL LOW (ref 11.5–15.5)
WBC: 6.6 10*3/uL (ref 4.0–10.5)
nRBC: 0 % (ref 0.0–0.2)

## 2024-03-14 LAB — BASIC METABOLIC PANEL WITH GFR
Anion gap: 6 (ref 5–15)
BUN: 10 mg/dL (ref 6–20)
CO2: 26 mmol/L (ref 22–32)
Calcium: 10.3 mg/dL (ref 8.9–10.3)
Chloride: 105 mmol/L (ref 98–111)
Creatinine, Ser: 0.69 mg/dL (ref 0.44–1.00)
GFR, Estimated: >60 mL/min (ref >60)
Glucose, Bld: 190 mg/dL — ABNORMAL HIGH (ref 70–99)
Potassium: 3.4 mmol/L — ABNORMAL LOW (ref 3.5–5.1)
Sodium: 137 mmol/L (ref 135–145)

## 2024-03-14 LAB — HCG, SERUM, QUALITATIVE: Preg, Serum: NEGATIVE

## 2024-03-14 NOTE — ED Triage Notes (Signed)
 The pt is c/o chest pain after a bout of tachycardia approx one hour ago  the pt is anxious  she has anxiety attacks  lmp one week ago

## 2024-03-14 NOTE — Discharge Instructions (Addendum)
 Your cardiac testing today was normal. Please follow-up with your primary care doctor. Return here for new concerns.

## 2024-03-14 NOTE — ED Notes (Signed)
 Patient returned from X-ray

## 2024-03-14 NOTE — ED Provider Notes (Signed)
 [place] EMERGENCY DEPARTMENT AT [facility] Provider Note   CSN: [id number] Arrival date & time: 03/14/24  1945     History  Chief Complaint  Patient presents with   Tachycardia    Susan Clements is a 33 y.o. female.  The history is provided by the patient and medical records.   33 year old female with history of anxiety, GERD, hyperlipidemia, presenting to the ED for concern of tachycardia.  Patient states over the past few weeks she has been very stressed out which has been answering of her anxiety.  Today she was anxious at home, developed chest pain and tachycardia.  States since she started panicking thinking she was having a heart attack which made her symptoms worse.  By time of my evaluation all symptoms have resolved and she feels back to baseline.  She denies any prior cardiac history.  She does vape.  No history of DVT or PE.  She is on OCP's.  Denies SOB, cough, fever, chills, etc.  Home Medications Prior to Admission medications   Medication Sig Start Date End Date Taking? Authorizing Provider  albuterol (VENTOLIN HFA) 108 (90 Base) MCG/ACT inhaler Inhale 2 puffs into the lungs every 4 (four) hours as needed for wheezing or shortness of breath. Patient not taking: Reported on 03/30/2023 09/21/22   Zenia Resides, MD  Ascorbic Acid (VITAMIN C PO) Take 1 tablet by mouth daily. Patient not taking: Reported on 01/23/2024    [provider]  nitrofurantoin, macrocrystal-monohydrate, (MACROBID) 100 MG capsule Take 1 capsule (100 mg total) by mouth 2 (two) times daily. Patient not taking: Reported on 01/23/2024 10/05/23   Margaretann Loveless, PA-C  Norethindrone Acetate-Ethinyl Estrad-FE (LOESTRIN 24 FE) 1-20 MG-MCG(24) tablet Take 1 tablet by mouth daily. 01/24/24   Clement Sayres E, PA-C  traMADol (ULTRAM) 50 MG tablet Take 1-2 tablets (50-100 mg total) by mouth every 6 (six) hours as needed for moderate pain or severe pain. Patient not taking: Reported on  01/23/2024 03/30/23   Karie Soda, MD  VITAMIN D PO Take 1 tablet by mouth daily. Patient not taking: Reported on 01/23/2024    [provider]      Allergies    Shellfish-derived products and Nsaids    Review of Systems   Review of Systems  Cardiovascular:  Positive for chest pain and palpitations.  All other systems reviewed and are negative.   Physical Exam Updated Vital Signs BP (!) 159/74 (BP Location: Right Arm)   Pulse (!) 102   Temp 98.6 F (37 C) (Oral)   Resp 18   Ht 5\' 4"  (1.626 m)   Wt 108.4 kg   LMP 03/07/2024   SpO2 100%   BMI 41.02 kg/m   Physical Exam Vitals and nursing note reviewed.  Constitutional:      Appearance: She is well-developed.  HENT:     Head: Normocephalic and atraumatic.  Eyes:     Conjunctiva/sclera: Conjunctivae normal.     Pupils: Pupils are equal, round, and reactive to light.  Cardiovascular:     Rate and Rhythm: Normal rate and regular rhythm.     Heart sounds: Normal heart sounds.  Pulmonary:     Effort: Pulmonary effort is normal.     Breath sounds: Normal breath sounds.  Abdominal:     General: Bowel sounds are normal.     Palpations: Abdomen is soft.  Musculoskeletal:        General: Normal range of motion.     Cervical  back: Normal range of motion.  Skin:    General: Skin is warm and dry.  Neurological:     Mental Status: She is alert and oriented to person, place, and time.     ED Results / Procedures / Treatments   Labs (all labs ordered are listed, but only abnormal results are displayed) Labs Reviewed  BASIC METABOLIC PANEL WITH GFR - Abnormal; Notable for the following components:      Result Value   Potassium 3.4 (*)    Glucose, Bld 190 (*)    All other components within normal limits  CBC - Abnormal; Notable for the following components:   RDW 11.4 (*)    All other components within normal limits  HCG, SERUM, QUALITATIVE  TROPONIN I (HIGH SENSITIVITY)  TROPONIN I (HIGH SENSITIVITY)     EKG EKG Interpretation Date/Time:  Friday March 14 2024 20:19:09 EDT Ventricular Rate:  104 PR Interval:  174 QRS Duration:  82 QT Interval:  288 QTC Calculation: 378 R Axis:   59  Text Interpretation: Sinus tachycardia Nonspecific T wave abnormality Abnormal ECG When compared with ECG of 13-Jan-2021 22:52, PREVIOUS ECG IS PRESENT Confirmed by Alona Bene [phone]) on 03/14/2024 9:34:22 PM  Radiology DG Chest 2 View Result Date: 03/14/2024 CLINICAL DATA:  Chest pain EXAM: CHEST - 2 VIEW COMPARISON:  None Available. FINDINGS: Normal cardiomediastinal silhouette. No focal consolidation, pleural effusion, or pneumothorax. No displaced rib fractures. IMPRESSION: No acute cardiopulmonary disease. Electronically Signed   By: Minerva Fester M.D.   On: 03/14/2024 21:44    Procedures Procedures    Medications Ordered in ED Medications - No data to display  ED Course/ Medical Decision Making/ A&P                                 Medical Decision Making  33 year old female presenting to the ED with chest pain and palpitations.  Seem to occur in relation to anxiety attack.  Has been under a great deal of stress recently.  She is afebrile and nontoxic in appearance here.  EKG is sinus rhythm without acute ischemic changes.  Labs reassuring, trop negative x2.  CXR is clear.  She is currently on OCP's however symptoms resolved now.  HR has returned to WNL without intervention.  No SOB.  No hx of DVT or PE.  No pleuritic type pain.  Low suspicion for PE.  Not consistent with ACS.  Appears stable for discharge to follow-up with PCP.  Can return here for new concerns.  Final Clinical Impression(s) / ED Diagnoses Final diagnoses:  Atypical chest pain  Palpitations    Rx / DC Orders ED Discharge Orders     None         Garlon Hatchet, PA-C 03/14/24 2332    Maia Plan, MD 03/17/24 [phone]

## 2024-04-19 ENCOUNTER — Telehealth: Admitting: Nurse Practitioner

## 2024-04-19 DIAGNOSIS — B9789 Other viral agents as the cause of diseases classified elsewhere: Secondary | ICD-10-CM

## 2024-04-19 DIAGNOSIS — J019 Acute sinusitis, unspecified: Secondary | ICD-10-CM

## 2024-04-19 MED ORDER — PREDNISONE 20 MG PO TABS: 20.0000 mg | ORAL_TABLET | Freq: Every day | ORAL | 0 refills | Status: AC

## 2024-04-19 MED ORDER — IPRATROPIUM BROMIDE 0.03 % NA SOLN: 2.0000 | Freq: Two times a day (BID) | NASAL | 0 refills | Status: DC

## 2024-04-19 NOTE — Patient Instructions (Signed)
 Tarea Clements, thank you for joining Collins Dean, NP for today's virtual visit.  While this provider is not your primary care provider (PCP), if your PCP is located in our provider database this encounter information will be shared with them immediately following your visit.   A [facility] MyChart account gives you access to today's visit and all your visits, tests, and labs performed at [facility] " click here if you don't have a [facility] MyChart account or go to [email or web address]  Consent: (Patient) Susan Clements provided verbal consent for this virtual visit at the beginning of the encounter.  Current Medications:  Current Outpatient Medications:    ipratropium (ATROVENT) 0.03 % nasal spray, Place 2 sprays into both nostrils every 12 (twelve) hours., Disp: 30 mL, Rfl: 0   predniSONE  (DELTASONE ) 20 MG tablet, Take 1 tablet (20 mg total) by mouth daily with breakfast for 5 days., Disp: 5 tablet, Rfl: 0   albuterol  (VENTOLIN  HFA) 108 (90 Base) MCG/ACT inhaler, Inhale 2 puffs into the lungs every 4 (four) hours as needed for wheezing or shortness of breath. (Patient not taking: Reported on 03/30/2023), Disp: 1 each, Rfl: 0   Ascorbic Acid  (VITAMIN C  PO), Take 1 tablet by mouth daily. (Patient not taking: Reported on 01/23/2024), Disp: , Rfl:    Norethindrone Acetate-Ethinyl Estrad-FE (LOESTRIN 24 FE) 1-20 MG-MCG(24) tablet, Take 1 tablet by mouth daily., Disp: 28 tablet, Rfl: 11   traMADol  (ULTRAM ) 50 MG tablet, Take 1-2 tablets (50-100 mg total) by mouth every 6 (six) hours as needed for moderate pain or severe pain. (Patient not taking: Reported on 01/23/2024), Disp: 20 tablet, Rfl: 0   VITAMIN D PO, Take 1 tablet by mouth daily. (Patient not taking: Reported on 01/23/2024), Disp: , Rfl:    Medications ordered in this encounter:  Meds ordered this encounter  Medications   ipratropium (ATROVENT) 0.03 % nasal spray    Sig: Place 2 sprays into both nostrils every  12 (twelve) hours.    Dispense:  30 mL    Refill:  0    Supervising Provider:   Corine Dice [[id number]]   predniSONE  (DELTASONE ) 20 MG tablet    Sig: Take 1 tablet (20 mg total) by mouth daily with breakfast for 5 days.    Dispense:  5 tablet    Refill:  0    Supervising Provider:   Corine Dice [[id number]]     *If you need refills on other medications prior to your next appointment, please contact your pharmacy*  Follow-Up: Call back or seek an in-person evaluation if the symptoms worsen or if the condition fails to improve as anticipated.  [facility] [phone]  Other Instructions Nasal saline irrigation Wear mask when gardening or yard work   If you have been instructed to have an in-person evaluation today at a local Urgent Care facility, please use the link below. It will take you to a list of all of our available [facility], including address, phone number and hours of operation. Please do not delay care.  [facility]  If you or a family member do not have a primary care provider, use the link below to schedule a visit and establish care. When you choose a [facility] primary care physician or advanced practice provider, you gain a long-term partner in health. Find a Primary Care Provider  Learn more about [facility] in-office and virtual care options: [employer] - Get Care  Now

## 2024-04-19 NOTE — Progress Notes (Signed)
 Virtual Visit Consent   [name], you are scheduled for a virtual visit with a [facility] provider today. Just as with appointments in the office, your consent must be obtained to participate. Your consent will be active for this visit and any virtual visit you may have with one of our providers in the next 365 days. If you have a MyChart account, a copy of this consent can be sent to you electronically.  As this is a virtual visit, video technology does not allow for your provider to perform a traditional examination. This may limit your provider's ability to fully assess your condition. If your provider identifies any concerns that need to be evaluated in person or the need to arrange testing (such as labs, EKG, etc.), we will make arrangements to do so. Although advances in technology are sophisticated, we cannot ensure that it will always work on either your end or our end. If the connection with a video visit is poor, the visit may have to be switched to a telephone visit. With either a video or telephone visit, we are not always able to ensure that we have a secure connection.  By engaging in this virtual visit, you consent to the provision of healthcare and authorize for your insurance to be billed (if applicable) for the services provided during this visit. Depending on your insurance coverage, you may receive a charge related to this service.  I need to obtain your verbal consent now. Are you willing to proceed with your visit today? Susan Clements has provided verbal consent on 04/19/2024 for a virtual visit (video or telephone). Collins Dean, NP  Date: 04/19/2024 6:27 PM   Virtual Visit via Video Note   I, Collins Dean, connected with  Susan Clements  ([id number], 08-02-1991) on 04/19/24 at  6:30 PM EDT by a video-enabled telemedicine application and verified that I am speaking with the correct person using two identifiers.  Location: Patient: Virtual Visit Location  Patient: Home Provider: Virtual Visit Location Provider: Home Office   I discussed the limitations of evaluation and management by telemedicine and the availability of in person appointments. The patient expressed understanding and agreed to proceed.    History of Present Illness: Susan Clements is a 33 y.o. who identifies as a female who was assigned female at birth, and is being seen today for viral sinusitis . Ms.Abdu-Matin was out in the yard this week and exposed to leaves, grass, pollen, dirt  and trees. Since then she has been experiencing symptoms of severe nasal congestion, bilateral ear pressure, loss of smell, rhinorrhea and head congestion. She is using claritin and zyrtec as needed with no relief.    Problems:  Patient Active Problem List   Diagnosis Date Noted   Appendicitis 03/29/2023   Encounter for postpartum visit 06/13/2021   [redacted] weeks gestation of pregnancy 05/02/2021   Vaginal delivery 05/01/2021   IUD (intrauterine device) in place 05/01/2021   Shoulder dystocia, delivered 05/01/2021   Encounter for elective induction of labor 04/30/2021   BMI 45.0-49.9, adult (HCC) 04/05/2021   Gestational diabetes 02/20/2021   Heartburn during pregnancy, antepartum 12/31/2020   Anxiety 12/31/2020   Major depressive disorder, single episode, severe (HCC)    Obesity during pregnancy 11/05/2020   Supervision of high risk pregnancy, antepartum 10/29/2020    Allergies:  Allergies  Allergen Reactions   Shellfish-Derived Products Anaphylaxis   Nsaids    Medications:  Current Outpatient Medications:    ipratropium (ATROVENT) 0.03 % nasal  spray, Place 2 sprays into both nostrils every 12 (twelve) hours., Disp: 30 mL, Rfl: 0   predniSONE  (DELTASONE ) 20 MG tablet, Take 1 tablet (20 mg total) by mouth daily with breakfast for 5 days., Disp: 5 tablet, Rfl: 0   albuterol  (VENTOLIN  HFA) 108 (90 Base) MCG/ACT inhaler, Inhale 2 puffs into the lungs every 4 (four) hours as needed for  wheezing or shortness of breath. (Patient not taking: Reported on 03/30/2023), Disp: 1 each, Rfl: 0   Ascorbic Acid  (VITAMIN C  PO), Take 1 tablet by mouth daily. (Patient not taking: Reported on 01/23/2024), Disp: , Rfl:    Norethindrone Acetate-Ethinyl Estrad-FE (LOESTRIN 24 FE) 1-20 MG-MCG(24) tablet, Take 1 tablet by mouth daily., Disp: 28 tablet, Rfl: 11   traMADol  (ULTRAM ) 50 MG tablet, Take 1-2 tablets (50-100 mg total) by mouth every 6 (six) hours as needed for moderate pain or severe pain. (Patient not taking: Reported on 01/23/2024), Disp: 20 tablet, Rfl: 0   VITAMIN D PO, Take 1 tablet by mouth daily. (Patient not taking: Reported on 01/23/2024), Disp: , Rfl:   Observations/Objective: Patient is well-developed, well-nourished in no acute distress.  Resting comfortably at home.  Head is normocephalic, atraumatic.  No labored breathing.  Speech is clear and coherent with logical content.  Patient is alert and oriented at baseline.    Assessment and Plan: 1. Acute viral sinusitis (Primary) - ipratropium (ATROVENT) 0.03 % nasal spray; Place 2 sprays into both nostrils every 12 (twelve) hours.  Dispense: 30 mL; Refill: 0 - predniSONE  (DELTASONE ) 20 MG tablet; Take 1 tablet (20 mg total) by mouth daily with breakfast for 5 days.  Dispense: 5 tablet; Refill: 0  Nasal saline irrigation Wear mask when gardening or yard work  Follow Up Instructions: I discussed the assessment and treatment plan with the patient. The patient was provided an opportunity to ask questions and all were answered. The patient agreed with the plan and demonstrated an understanding of the instructions.  A copy of instructions were sent to the patient via MyChart unless otherwise noted below.    The patient was advised to call back or seek an in-person evaluation if the symptoms worsen or if the condition fails to improve as anticipated.    Jevin Camino [address] [place] Plasencia, NP

## 2024-07-10 ENCOUNTER — Other Ambulatory Visit: Payer: Self-pay

## 2024-07-10 ENCOUNTER — Ambulatory Visit (HOSPITAL_COMMUNITY)
Admission: EM | Admit: 2024-07-10 | Discharge: 2024-07-10 | Disposition: A | Discharge status: Home / Self Care | Attending: Emergency Medicine | Admitting: Emergency Medicine

## 2024-07-10 ENCOUNTER — Encounter (HOSPITAL_COMMUNITY): Payer: Self-pay

## 2024-07-10 DIAGNOSIS — R5383 Other fatigue: Secondary | ICD-10-CM | POA: Insufficient documentation

## 2024-07-10 DIAGNOSIS — R42 Dizziness and giddiness: Secondary | ICD-10-CM | POA: Insufficient documentation

## 2024-07-10 DIAGNOSIS — B349 Viral infection, unspecified: Secondary | ICD-10-CM | POA: Diagnosis present

## 2024-07-10 DIAGNOSIS — Z3202 Encounter for pregnancy test, result negative: Secondary | ICD-10-CM

## 2024-07-10 LAB — COMPREHENSIVE METABOLIC PANEL WITH GFR
ALT: 13 U/L (ref 0–44)
AST: 34 U/L (ref 15–41)
Albumin: 4.2 g/dL (ref 3.5–5.0)
Alkaline Phosphatase: 56 U/L (ref 38–126)
Anion gap: 10 (ref 5–15)
BUN: 11 mg/dL (ref 6–20)
CO2: 16 mmol/L — ABNORMAL LOW (ref 22–32)
Calcium: 10.1 mg/dL (ref 8.9–10.3)
Chloride: 109 mmol/L (ref 98–111)
Creatinine, Ser: 0.65 mg/dL (ref 0.44–1.00)
GFR, Estimated: >60 mL/min (ref >60)
Glucose, Bld: 62 mg/dL — ABNORMAL LOW (ref 70–99)
Potassium: 5.9 mmol/L — ABNORMAL HIGH (ref 3.5–5.1)
Sodium: 135 mmol/L (ref 135–145)
Total Bilirubin: 1.7 mg/dL — ABNORMAL HIGH (ref 0.0–1.2)
Total Protein: 7.2 g/dL (ref 6.5–8.1)

## 2024-07-10 LAB — CBC
HCT: 38.9 % (ref 36.0–46.0)
Hemoglobin: 12.6 g/dL (ref 12.0–15.0)
MCH: 30.7 pg (ref 26.0–34.0)
MCHC: 32.4 g/dL (ref 30.0–36.0)
MCV: 94.6 fL (ref 80.0–100.0)
Platelets: 369 K/uL (ref 150–400)
RBC: 4.11 MIL/uL (ref 3.87–5.11)
RDW: 11.4 % — ABNORMAL LOW (ref 11.5–15.5)
WBC: 3.5 K/uL — ABNORMAL LOW (ref 4.0–10.5)
nRBC: 0 % (ref 0.0–0.2)

## 2024-07-10 LAB — POCT FASTING CBG KUC MANUAL ENTRY: POCT Glucose (KUC): 101 mg/dL — AB (ref 70–99)

## 2024-07-10 LAB — POC SARS CORONAVIRUS 2 AG -  ED: SARS Coronavirus 2 Ag: NEGATIVE

## 2024-07-10 LAB — POCT URINE PREGNANCY: Preg Test, Ur: NEGATIVE

## 2024-07-10 MED ORDER — MECLIZINE HCL 25 MG PO TABS: 25.0000 mg | ORAL_TABLET | Freq: Three times a day (TID) | ORAL | 0 refills | Status: DC | PRN

## 2024-07-10 NOTE — Discharge Instructions (Addendum)
 Your COVID test was negative.  I do think your symptoms could be related to an underlying viral illness that is now causing some dizziness.   Your workup today was overall reassuring.  We have drawn some labs to rule out underlying anemia, electrolyte imbalance, and evaluate kidney function to determine underlying causes for your symptoms. You can take meclizine  every 8 hours as needed for dizziness. Follow-up with your primary care provider or return here as needed. If you develop severe weakness, passing out, excessive vomiting, severe headache, high fevers, confusion, slurred speech, or facial droop please seek immediate medical treatment in the emergency department.

## 2024-07-10 NOTE — ED Provider Notes (Addendum)
 [facility]    CSN: [id number] Arrival date & time: 07/10/24  1023      History   Chief Complaint Chief Complaint  Patient presents with   Fatigue    HPI Susan Clements is a 33 y.o. female.   Patient presents with intermittent chest tightness, wheezing, and upper back pain that began on 7/21.  Patient states that the symptoms have subsided some, but will still occur intermittently.  Patient states that she has also had some mild cough, congestion, fatigue, and loss of appetite that began on 7/22.  Denies known fever, chills, nausea, vomiting, diarrhea, chest pain, abdominal pain.  Patient states that yesterday she began to become very dizzy while sitting at her desk working.  Patient states since then the dizziness has continued and worsens upon standing.  While patient was waiting in the room she stood up and reported that she felt like she was going to pass out.  Patient sat back down and states that she still continues to have dizziness but no longer feels like she is going to pass out.  Patient denies taking any medication for her symptoms.  Patient states that she has been taking vitamins and drinking coconut water .  Patient states that the symptoms are similar to when she had COVID in the past.  Patient denies any known sick exposures.  Patient does report history of asthma, but states that she has not had an albuterol  inhaler for a while and therefore was unable to use this.  LMP 7/19.  The history is provided by the patient and medical records.    Past Medical History:  Diagnosis Date   Asthma    Gestational diabetes     Patient Active Problem List   Diagnosis Date Noted   Appendicitis 03/29/2023   Encounter for postpartum visit 06/13/2021   [redacted] weeks gestation of pregnancy 05/02/2021   Vaginal delivery 05/01/2021   IUD (intrauterine device) in place 05/01/2021   Shoulder dystocia, delivered 05/01/2021   Encounter for elective induction of labor  04/30/2021   BMI 45.0-49.9, adult (HCC) 04/05/2021   Gestational diabetes 02/20/2021   Heartburn during pregnancy, antepartum 12/31/2020   Anxiety 12/31/2020   Major depressive disorder, single episode, severe (HCC)    Obesity during pregnancy 11/05/2020   Supervision of high risk pregnancy, antepartum 10/29/2020    Past Surgical History:  Procedure Laterality Date   LAPAROSCOPIC APPENDECTOMY N/A 03/30/2023   Procedure: APPENDECTOMY LAPAROSCOPIC;  Surgeon: Sheldon Standing, MD;  Location: [facility];  Service: General;  Laterality: N/A;   NO PAST SURGERIES      OB History     Gravida  5   Para  3   Term  3   Preterm      AB  2   Living  3      SAB      IAB  2   Ectopic      Multiple  0   Live Births  3            Home Medications    Prior to Admission medications   Medication Sig Start Date End Date Taking? Authorizing Provider  meclizine  (ANTIVERT ) 25 MG tablet Take 1 tablet (25 mg total) by mouth 3 (three) times daily as needed for dizziness. 07/10/24  Yes Johnie Flaming A, NP  albuterol  (VENTOLIN  HFA) 108 (90 Base) MCG/ACT inhaler Inhale 2 puffs into the lungs every 4 (four) hours as needed for wheezing or shortness of breath. Patient not  taking: Reported on 03/30/2023 09/21/22   Vonna Sharlet POUR, MD  Ascorbic Acid  (VITAMIN C  PO) Take 1 tablet by mouth daily. Patient not taking: Reported on 01/23/2024    [provider]  ipratropium (ATROVENT ) 0.03 % nasal spray Place 2 sprays into both nostrils every 12 (twelve) hours. 04/19/24   Fleming, Zelda W, NP  Norethindrone Acetate-Ethinyl Estrad-FE (LOESTRIN 24 FE) 1-20 MG-MCG(24) tablet Take 1 tablet by mouth daily. Patient not taking: Reported on 07/10/2024 01/24/24   Davis, Devon E, PA-C  traMADol  (ULTRAM ) 50 MG tablet Take 1-2 tablets (50-100 mg total) by mouth every 6 (six) hours as needed for moderate pain or severe pain. Patient not taking: Reported on 01/23/2024 03/30/23   Sheldon Standing, MD  VITAMIN D PO  Take 1 tablet by mouth daily. Patient not taking: Reported on 01/23/2024    [provider]    Family History Family History  Problem Relation Age of Onset   Healthy Mother    Diabetes Father    Throat cancer Maternal Grandmother    Hypertension Maternal Grandmother    Diabetes Maternal Grandmother    Heart failure Maternal Grandmother    Hypertension Paternal Grandmother     Social History Social History   Tobacco Use   Smoking status: Former    Current packs/day: 0.00    Types: Cigars, Cigarettes    Quit date: 09/10/2020    Years since quitting: 3.8   Smokeless tobacco: Current   Tobacco comments:    on patch  Vaping Use   Vaping status: Every Day  Substance Use Topics   Alcohol use: Yes    Comment: rarely   Drug use: Not Currently     Allergies   Shellfish-derived products and Nsaids   Review of Systems Review of Systems  Per HPI  Physical Exam Triage Vital Signs ED Triage Vitals  Encounter Vitals Group     BP 07/10/24 1039 114/73     Girls Systolic BP Percentile --      Girls Diastolic BP Percentile --      Boys Systolic BP Percentile --      Boys Diastolic BP Percentile --      Pulse Rate 07/10/24 1039 84     Resp 07/10/24 1039 16     Temp 07/10/24 1039 98.7 F (37.1 C)     Temp Source 07/10/24 1039 Oral     SpO2 07/10/24 1039 97 %     Weight --      Height --      Head Circumference --      Peak Flow --      Pain Score 07/10/24 1041 0     Pain Loc --      Pain Education --      Exclude from Growth Chart --    Orthostatic VS for the past 24 hrs:  BP- Lying Pulse- Lying BP- Sitting Pulse- Sitting BP- Standing at 0 minutes Pulse- Standing at 0 minutes  07/10/24 1213 99/68 58 105/68 71 104/72 81    Updated Vital Signs BP 114/73 (BP Location: Right Arm)   Pulse 84   Temp 98.7 F (37.1 C) (Oral)   Resp 16   LMP 07/05/2024   SpO2 97%   Breastfeeding No   Visual Acuity Right Eye Distance:   Left Eye Distance:   Bilateral  Distance:    Right Eye Near:   Left Eye Near:    Bilateral Near:     Physical Exam Vitals and  nursing note reviewed.  Constitutional:      General: She is awake. She is not in acute distress.    Appearance: Normal appearance. She is well-developed and well-groomed. She is not ill-appearing.  HENT:     Right Ear: Tympanic membrane, ear canal and external ear normal.     Left Ear: Tympanic membrane, ear canal and external ear normal.     Nose: Congestion and rhinorrhea present.     Mouth/Throat:     Mouth: Mucous membranes are moist.     Pharynx: Posterior oropharyngeal erythema and postnasal drip present. No oropharyngeal exudate.  Cardiovascular:     Rate and Rhythm: Normal rate and regular rhythm.  Pulmonary:     Effort: Pulmonary effort is normal.     Breath sounds: Normal breath sounds.  Musculoskeletal:        General: Normal range of motion.  Skin:    General: Skin is warm and dry.  Neurological:     General: No focal deficit present.     Mental Status: She is alert and oriented to person, place, and time. Mental status is at baseline.     GCS: GCS eye subscore is 4. GCS verbal subscore is 5. GCS motor subscore is 6.     Cranial Nerves: Cranial nerves 2-12 are intact.     Sensory: Sensation is intact.     Motor: Motor function is intact.     Coordination: Coordination is intact.     Gait: Gait is intact.  Psychiatric:        Behavior: Behavior is cooperative.      UC Treatments / Results  Labs (all labs ordered are listed, but only abnormal results are displayed) Labs Reviewed  POCT FASTING CBG [facility] MANUAL ENTRY - Abnormal; Notable for the following components:      Result Value   POCT Glucose ([facility]) 101 (*)    All other components within normal limits  CBC  COMPREHENSIVE METABOLIC PANEL WITH GFR  POC SARS CORONAVIRUS 2 AG -  ED  POCT URINE PREGNANCY    EKG   Radiology No results found.  Procedures Procedures (including critical care  time)  Medications Ordered in UC Medications - No data to display  Initial Impression / Assessment and Plan / UC Course  I have reviewed the triage vital signs and the nursing notes.  Pertinent labs & imaging results that were available during my care of the patient were reviewed by me and considered in my medical decision making (see chart for details).     Patient is overall well-appearing.  Vitals are stable.  Mild congestion and rhinorrhea present, mild erythema and PND noted to pharynx.  Lungs clear bilaterally to auscultation.  No other significant findings upon exam.  No neurodeficits noted.  GCS is 15.  Negative Romberg on exam.    Orthostatic vital signs unremarkable.  Urine pregnancy negative.  Nonfasting CBG within normal limits.  COVID testing negative.  EKG revealed normal sinus rhythm with sinus arrhythmia.  Without ST elevation, depression, or acute cardiac findings.  Symptoms likely related to underlying viral illness.  Ordered CBC and CMP to rule out underlying causes related to dizziness and fatigue.  Prescribed meclizine  for dizziness.  Discussed follow-up, return, and strict ER precautions. Final Clinical Impressions(s) / UC Diagnoses   Final diagnoses:  Viral illness  Dizziness  Other fatigue     Discharge Instructions      Your COVID test was negative.  I do think your symptoms  could be related to an underlying viral illness that is now causing some dizziness.   Your workup today was overall reassuring.  We have drawn some labs to rule out underlying anemia, electrolyte imbalance, and evaluate kidney function to determine underlying causes for your symptoms. You can take meclizine  every 8 hours as needed for dizziness. Follow-up with your primary care provider or return here as needed. If you develop severe weakness, passing out, excessive vomiting, severe headache, high fevers, confusion, slurred speech, or facial droop please seek immediate medical treatment  in the emergency department.      ED Prescriptions     Medication Sig Dispense Auth. Provider   meclizine  (ANTIVERT ) 25 MG tablet Take 1 tablet (25 mg total) by mouth 3 (three) times daily as needed for dizziness. 30 tablet Johnie Flaming A, NP      PDMP not reviewed this encounter.   Johnie Flaming LABOR, NP 07/10/24 1237    Johnie Flaming A, NP 07/10/24 1239

## 2024-07-10 NOTE — ED Triage Notes (Signed)
 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 States on Monday had chest tightness and wheezing that subside. States has been lethargic since Tuesday night and hasn't had a appetite. States these are the same sx's when she had covid. Denies taken vitamins and drinking coconut water .

## 2024-07-11 ENCOUNTER — Encounter (HOSPITAL_COMMUNITY): Payer: Self-pay

## 2024-07-11 ENCOUNTER — Other Ambulatory Visit: Payer: Self-pay

## 2024-07-11 ENCOUNTER — Ambulatory Visit: Payer: Self-pay

## 2024-07-11 ENCOUNTER — Emergency Department (HOSPITAL_COMMUNITY)
Admission: EM | Admit: 2024-07-11 | Discharge: 2024-07-12 | Disposition: A | Discharge status: Home / Self Care | Attending: Emergency Medicine | Admitting: Emergency Medicine

## 2024-07-11 DIAGNOSIS — R252 Cramp and spasm: Secondary | ICD-10-CM | POA: Diagnosis not present

## 2024-07-11 DIAGNOSIS — R799 Abnormal finding of blood chemistry, unspecified: Secondary | ICD-10-CM | POA: Diagnosis present

## 2024-07-11 DIAGNOSIS — R5383 Other fatigue: Secondary | ICD-10-CM | POA: Diagnosis not present

## 2024-07-11 DIAGNOSIS — R899 Unspecified abnormal finding in specimens from other organs, systems and tissues: Secondary | ICD-10-CM

## 2024-07-11 LAB — CBC
HCT: 36.4 % (ref 36.0–46.0)
Hemoglobin: 12.1 g/dL (ref 12.0–15.0)
MCH: 30.8 pg (ref 26.0–34.0)
MCHC: 33.2 g/dL (ref 30.0–36.0)
MCV: 92.6 fL (ref 80.0–100.0)
Platelets: 354 K/uL (ref 150–400)
RBC: 3.93 MIL/uL (ref 3.87–5.11)
RDW: 11.3 % — ABNORMAL LOW (ref 11.5–15.5)
WBC: 5.2 K/uL (ref 4.0–10.5)
nRBC: 0 % (ref 0.0–0.2)

## 2024-07-11 LAB — COMPREHENSIVE METABOLIC PANEL WITH GFR
ALT: 14 U/L (ref 0–44)
AST: 17 U/L (ref 15–41)
Albumin: 4.1 g/dL (ref 3.5–5.0)
Alkaline Phosphatase: 62 U/L (ref 38–126)
Anion gap: 7 (ref 5–15)
BUN: 10 mg/dL (ref 6–20)
CO2: 25 mmol/L (ref 22–32)
Calcium: 10.2 mg/dL (ref 8.9–10.3)
Chloride: 105 mmol/L (ref 98–111)
Creatinine, Ser: 0.67 mg/dL (ref 0.44–1.00)
GFR, Estimated: >60 mL/min (ref >60)
Glucose, Bld: 107 mg/dL — ABNORMAL HIGH (ref 70–99)
Potassium: 3.6 mmol/L (ref 3.5–5.1)
Sodium: 137 mmol/L (ref 135–145)
Total Bilirubin: 1.1 mg/dL (ref 0.0–1.2)
Total Protein: 7.2 g/dL (ref 6.5–8.1)

## 2024-07-11 LAB — RESP PANEL BY RT-PCR (RSV, FLU A&B, COVID)  RVPGX2
Influenza A by PCR: NEGATIVE
Influenza B by PCR: NEGATIVE
Resp Syncytial Virus by PCR: NEGATIVE
SARS Coronavirus 2 by RT PCR: NEGATIVE

## 2024-07-11 NOTE — ED Triage Notes (Signed)
 Pt to ED via POV for abnormal lab, reports went to UC yesterday for generalized weakness in legs x 2 days, labs  drawn, was notified that potassium was high and to come to ED for further eval

## 2024-07-11 NOTE — ED Provider Triage Note (Signed)
 Emergency Medicine Provider Triage Evaluation Note  Simora Abdul-Matin , a 33 y.o. female  was evaluated in triage.  Pt complains of abnormal lab, potassium was 5.9 when urgent care checked labs yesterday.  Yesterday was feeling sick, dizzy and had some cough thus labs were checked.  Review of Systems  Positive: Abnormal lab Negative: Rest pain  Physical Exam  BP 120/85 (BP Location: Left Arm)   Pulse 79   Temp 98.2 F (36.8 C)   Resp 20   Ht 5' 4 (1.626 m)   Wt 104.3 kg   LMP 07/05/2024   SpO2 96%   BMI 39.48 kg/m  Gen:   Awake, no distress   Resp:  Normal effort  MSK:   Moves extremities without difficulty Other:    Medical Decision Making  Medically screening exam initiated at 5:06 PM.  Appropriate orders placed.  Kewana Abdul-Matin was informed that the remainder of the evaluation will be completed by another provider, this initial triage assessment does not replace that evaluation, and the importance of remaining in the ED until their evaluation is complete.     Shermon Warren SAILOR, PA-C 07/11/24 1710

## 2024-07-12 LAB — TSH: TSH: 2.297 u[IU]/mL (ref 0.350–4.500)

## 2024-07-12 LAB — CK: Total CK: 64 U/L (ref 38–234)

## 2024-07-12 LAB — MAGNESIUM: Magnesium: 1.7 mg/dL (ref 1.7–2.4)

## 2024-07-12 MED ORDER — SODIUM CHLORIDE 0.9 % IV BOLUS
1000.0000 mL | Freq: Once | INTRAVENOUS | Status: AC
  Administered 2024-07-12: 1000 mL via INTRAVENOUS

## 2024-07-12 NOTE — ED Provider Notes (Signed)
 [facility] Emergency Department Provider Note MRN:  [id number]  Arrival date & time: 07/12/24     Chief Complaint   Abnormal Lab   History of Present Illness   Susan Clements is a 33 y.o. year-old female presents to the ED with chief complaint of abnormal lab.  States that she was seen yesterday at urgent care for generalized fatigue.  States that she had a reassuring workup, but had labs pending when she left.  States that she got a phone call telling her that her potassium was very high and she due to come to the emergency department for evaluation.  Patient states that she had some leg cramps yesterday, but these have improved today.  States that she has been working a lot and lacks energy.  Denies any recent illness.[name]  History provided by patient.   Review of Systems  Pertinent positive and negative review of systems noted in HPI.    Physical Exam   Vitals:   07/12/24 0200 07/12/24 0230  BP: (!) 101/56   Pulse: 60   Resp: 18   Temp:  98.1 F (36.7 C)  SpO2: 99%     CONSTITUTIONAL: Nontoxic-appearing, NAD NEURO:  Alert and oriented x 3, CN 3-12 grossly intact EYES:  eyes equal and reactive ENT/NECK:  Supple, no stridor  CARDIO: Normal rate, regular rhythm, appears well-perfused  PULM:  No respiratory distress,  GI/GU:  non-distended,  MSK/SPINE:  No gross deformities, no edema, moves all extremities  SKIN:  no rash,  atraumatic   *Additional and/or pertinent findings included in MDM below  Diagnostic and Interventional Summary    EKG Interpretation Date/Time:  Saturday July 12 2024 00:51:21 EDT Ventricular Rate:  57 PR Interval:  201 QRS Duration:  82 QT Interval:  392 QTC Calculation: 382 R Axis:   55  Text Interpretation: Sinus arrhythmia No significant change was found Confirmed by Carita Senior [phone]) on 07/12/2024 1:12:14 AM       Labs Reviewed  CBC - Abnormal; Notable for the following components:      Result  Value   RDW 11.3 (*)    All other components within normal limits  COMPREHENSIVE METABOLIC PANEL WITH GFR - Abnormal; Notable for the following components:   Glucose, Bld 107 (*)    All other components within normal limits  RESP PANEL BY RT-PCR (RSV, FLU A&B, COVID)  RVPGX2  CK  MAGNESIUM  TSH    No orders to display    Medications  sodium chloride  0.9 % bolus 1,000 mL (0 mLs Intravenous Stopped 07/12/24 0226)     Procedures  /  Critical Care Procedures  ED Course and Medical Decision Making  I have reviewed the triage vital signs, the nursing notes, and pertinent available records from the EMR.  Social Determinants Affecting Complexity of Care: Patient has no clinically significant social determinants affecting this chief complaint..   ED Course:    Medical Decision Making Patient here with reported abnormal lab.  His potassium was 5.9 at urgent care yesterday, this was rechecked today and is now normal.  I added on magnesium and TSH given some reported muscle cramps and fatigue.  CK was also added.  These test results are normal.  I have a low suspicion for acute emergent process at this time.  I do think she is probably overworking herself given the multiple jobs and long hours she works.  Feel that increased hydration, and rest is indicated.  I do not think  that she requires any further emergent workup at this time.  Amount and/or Complexity of Data Reviewed Labs: ordered. ECG/medicine tests: ordered.         Consultants: No consultations were needed in caring for this patient.   Treatment and Plan: I considered admission due to patient's initial presentation, but after considering the examination and diagnostic results, patient will not require admission and can be discharged with outpatient follow-up.    Final Clinical Impressions(s) / ED Diagnoses     ICD-10-CM   1. Abnormal laboratory test  R89.9       ED Discharge Orders     None          Discharge Instructions Discussed with and Provided to Patient:    Discharge Instructions      Your potassium level is normal here.  We also checked your magnesium and TSH, which were also normal.  I recommend that you follow-up with your primary care doctor.  Be certain that you are eating a healthy diet to stabilize your blood sugar.  Be certain that you are getting enough to drink.  Be certain to get plenty of rest, sleep, do not overwork yourself to the point of extreme fatigue.      Vicky Charleston, PA-C 07/12/24 0602    Carita Senior, MD 07/12/24 [phone]

## 2024-07-12 NOTE — Discharge Instructions (Signed)
 Your potassium level is normal here.  We also checked your magnesium and TSH, which were also normal.  I recommend that you follow-up with your primary care doctor.  Be certain that you are eating a healthy diet to stabilize your blood sugar.  Be certain that you are getting enough to drink.  Be certain to get plenty of rest, sleep, do not overwork yourself to the point of extreme fatigue.

## 2024-08-23 ENCOUNTER — Emergency Department (HOSPITAL_COMMUNITY)

## 2024-08-23 ENCOUNTER — Emergency Department (HOSPITAL_COMMUNITY)
Admission: EM | Admit: 2024-08-23 | Discharge: 2024-08-23 | Disposition: A | Discharge status: Home / Self Care | Attending: Emergency Medicine | Admitting: Emergency Medicine

## 2024-08-23 DIAGNOSIS — R7309 Other abnormal glucose: Secondary | ICD-10-CM | POA: Diagnosis not present

## 2024-08-23 DIAGNOSIS — J45909 Unspecified asthma, uncomplicated: Secondary | ICD-10-CM | POA: Insufficient documentation

## 2024-08-23 DIAGNOSIS — R062 Wheezing: Secondary | ICD-10-CM | POA: Diagnosis present

## 2024-08-23 DIAGNOSIS — U071 COVID-19: Secondary | ICD-10-CM | POA: Insufficient documentation

## 2024-08-23 LAB — RESP PANEL BY RT-PCR (RSV, FLU A&B, COVID)  RVPGX2
Influenza A by PCR: NEGATIVE
Influenza B by PCR: NEGATIVE
Resp Syncytial Virus by PCR: NEGATIVE
SARS Coronavirus 2 by RT PCR: POSITIVE — AB

## 2024-08-23 LAB — CBC
HCT: 38.4 % (ref 36.0–46.0)
Hemoglobin: 12.4 g/dL (ref 12.0–15.0)
MCH: 29.9 pg (ref 26.0–34.0)
MCHC: 32.3 g/dL (ref 30.0–36.0)
MCV: 92.5 fL (ref 80.0–100.0)
Platelets: 349 K/uL (ref 150–400)
RBC: 4.15 MIL/uL (ref 3.87–5.11)
RDW: 11.3 % — ABNORMAL LOW (ref 11.5–15.5)
WBC: 4.6 K/uL (ref 4.0–10.5)
nRBC: 0 % (ref 0.0–0.2)

## 2024-08-23 LAB — BASIC METABOLIC PANEL WITH GFR
Anion gap: 12 (ref 5–15)
BUN: 12 mg/dL (ref 6–20)
CO2: 20 mmol/L — ABNORMAL LOW (ref 22–32)
Calcium: 10.4 mg/dL — ABNORMAL HIGH (ref 8.9–10.3)
Chloride: 105 mmol/L (ref 98–111)
Creatinine, Ser: 0.6 mg/dL (ref 0.44–1.00)
GFR, Estimated: >60 mL/min (ref >60)
Glucose, Bld: 200 mg/dL — ABNORMAL HIGH (ref 70–99)
Potassium: 4.1 mmol/L (ref 3.5–5.1)
Sodium: 137 mmol/L (ref 135–145)

## 2024-08-23 LAB — TROPONIN T, HIGH SENSITIVITY: Troponin T High Sensitivity: <15 ng/L (ref 0–19)

## 2024-08-23 LAB — HCG, SERUM, QUALITATIVE: Preg, Serum: NEGATIVE

## 2024-08-23 MED ORDER — ALBUTEROL SULFATE HFA 108 (90 BASE) MCG/ACT IN AERS: 1.0000 | INHALATION_SPRAY | Freq: Four times a day (QID) | RESPIRATORY_TRACT | 0 refills | Status: AC | PRN

## 2024-08-23 MED ORDER — ALBUTEROL SULFATE HFA 108 (90 BASE) MCG/ACT IN AERS
2.0000 | INHALATION_SPRAY | Freq: Once | RESPIRATORY_TRACT | Status: AC
  Administered 2024-08-23: 2 via RESPIRATORY_TRACT
  Filled 2024-08-23: qty 6.7

## 2024-08-23 NOTE — ED Triage Notes (Signed)
 Patient in today reporting hx asthma with recent wheezing that has progressed. Also reports left ear pain with drainage.

## 2024-08-23 NOTE — Discharge Instructions (Addendum)
 Today you were seen for a COVID infection.  You may alternate taking Tylenol  and Motrin  as needed for fever and pain, Flonase as needed for nasal congestion, and plain Mucinex as needed for chest congestion.  You may return to work/school prior to the date listed on your excuse if you are fever free without Tylenol  or Motrin  for 24 hours and your symptoms are improving.  Thank you for letting us  treat you today. After reviewing your labs and imaging, I feel you are safe to go home. Please follow up with your PCP in the next several days and provide them with your records from this visit. Return to the Emergency Room if pain becomes severe or symptoms worsen.

## 2024-08-23 NOTE — ED Provider Notes (Signed)
 [place] EMERGENCY DEPARTMENT AT [facility] Provider Note   CSN: [id number] Arrival date & time: 08/23/24  1046     Patient presents with: Wheezing and Ear Fullness   Susan Clements is a 33 y.o. female past medical history significant for asthma presents today for wheezing that is progressively worsened and left ear pain with drainage.  Patient denies fever, chills, cough, congestion, nausea, vomiting, shortness of breath, chest pain, or headache.    Wheezing Associated symptoms: ear pain and shortness of breath   Ear Fullness Associated symptoms include shortness of breath.       Prior to Admission medications   Medication Sig Start Date End Date Taking? Authorizing Provider  albuterol  (VENTOLIN  HFA) 108 (90 Base) MCG/ACT inhaler Inhale 1-2 puffs into the lungs every 6 (six) hours as needed for wheezing or shortness of breath. 08/23/24  Yes Siyana Erney N, PA-C  albuterol  (VENTOLIN  HFA) 108 (90 Base) MCG/ACT inhaler Inhale 2 puffs into the lungs every 4 (four) hours as needed for wheezing or shortness of breath. Patient not taking: Reported on 03/30/2023 09/21/22   Vonna Sharlet POUR, MD  Ascorbic Acid  (VITAMIN C  PO) Take 1 tablet by mouth daily. Patient not taking: Reported on 01/23/2024    [provider]  ipratropium (ATROVENT ) 0.03 % nasal spray Place 2 sprays into both nostrils every 12 (twelve) hours. 04/19/24   Fleming, Zelda W, NP  meclizine  (ANTIVERT ) 25 MG tablet Take 1 tablet (25 mg total) by mouth 3 (three) times daily as needed for dizziness. 07/10/24   Johnie Flaming A, NP  Norethindrone Acetate-Ethinyl Estrad-FE (LOESTRIN 24 FE) 1-20 MG-MCG(24) tablet Take 1 tablet by mouth daily. Patient not taking: Reported on 07/10/2024 01/24/24   Davis, Devon E, PA-C  traMADol  (ULTRAM ) 50 MG tablet Take 1-2 tablets (50-100 mg total) by mouth every 6 (six) hours as needed for moderate pain or severe pain. Patient not taking: Reported on 01/23/2024 03/30/23   Sheldon Standing, MD  VITAMIN D PO Take 1 tablet by mouth daily. Patient not taking: Reported on 01/23/2024    [provider]    Allergies: Shellfish-derived products and Nsaids    Review of Systems  HENT:  Positive for ear pain.   Respiratory:  Positive for shortness of breath and wheezing.     Updated Vital Signs BP 128/82 (BP Location: Right Arm)   Pulse 71   Temp 98.4 F (36.9 C) (Oral)   Resp 18   LMP 08/18/2024 (Exact Date)   SpO2 99%   Physical Exam Vitals and nursing note reviewed.  Constitutional:      General: She is not in acute distress.    Appearance: She is well-developed. She is obese. She is not ill-appearing.  HENT:     Head: Normocephalic and atraumatic.     Mouth/Throat:     Mouth: Mucous membranes are moist.     Pharynx: Oropharynx is clear.  Eyes:     Extraocular Movements: Extraocular movements intact.     Conjunctiva/sclera: Conjunctivae normal.  Cardiovascular:     Rate and Rhythm: Normal rate and regular rhythm.     Pulses: Normal pulses.     Heart sounds: Normal heart sounds. No murmur heard. Pulmonary:     Effort: Pulmonary effort is normal. No respiratory distress.     Breath sounds: Normal breath sounds.  Abdominal:     Palpations: Abdomen is soft.     Tenderness: There is no abdominal tenderness.  Musculoskeletal:  General: No swelling.     Cervical back: Neck supple.  Skin:    General: Skin is warm and dry.     Capillary Refill: Capillary refill takes less than 2 seconds.  Neurological:     General: No focal deficit present.     Mental Status: She is alert and oriented to person, place, and time.  Psychiatric:        Mood and Affect: Mood normal.     (all labs ordered are listed, but only abnormal results are displayed) Labs Reviewed  RESP PANEL BY RT-PCR (RSV, FLU A&B, COVID)  RVPGX2 - Abnormal; Notable for the following components:      Result Value   SARS Coronavirus 2 by RT PCR POSITIVE (*)    All other components  within normal limits  BASIC METABOLIC PANEL WITH GFR - Abnormal; Notable for the following components:   CO2 20 (*)    Glucose, Bld 200 (*)    Calcium  10.4 (*)    All other components within normal limits  CBC - Abnormal; Notable for the following components:   RDW 11.3 (*)    All other components within normal limits  HCG, SERUM, QUALITATIVE  TROPONIN T, HIGH SENSITIVITY  TROPONIN T, HIGH SENSITIVITY    EKG: None  Radiology: DG Chest 2 View Result Date: 08/23/2024 CLINICAL DATA:  Wheezing EXAM: CHEST - 2 VIEW COMPARISON:  Chest radiograph dated 03/14/2024 FINDINGS: Normal lung volumes. No focal consolidations. No pleural effusion or pneumothorax. The heart size and mediastinal contours are within normal limits. No acute osseous abnormality. IMPRESSION: No active cardiopulmonary disease. Electronically Signed   By: Limin  Xu M.D.   On: 08/23/2024 11:15     Procedures   Medications Ordered in the ED  albuterol  (VENTOLIN  HFA) 108 (90 Base) MCG/ACT inhaler 2 puff (has no administration in time range)                                    Medical Decision Making Amount and/or Complexity of Data Reviewed Labs: ordered. Radiology: ordered.   This patient presents to the ED for concern of wheezing differential diagnosis includes asthma exacerbation, pneumonia, COVID, flu, RSV, URI   Lab Tests:  I Ordered, and personally interpreted labs.  The pertinent results include: CBC unremarkable, mildly reduced CO2 at 20, elevated glucose at 200, mildly elevated calcium  at 10.4, COVID-positive, pregnancy negative EKG showing sinus rhythm   Imaging Studies ordered:  I ordered imaging studies including chest x-ray I independently visualized and interpreted imaging which showed no active cardiopulmonary disease I agree with the radiologist interpretation   Problem List / ED Course:  Considered for admission or further workup however patients vital signs, physical exam, labs, and  imaging are reassuring. Patient's symptoms likely due to COVID infection.  Patient prescribed albuterol  inhaler.  Patient advised to take Tylenol /Motrin  as needed for fever, Flonase as needed for nasal congestion, and plain Mucinex as needed for chest congestion. Patient should follow-up with their primary care in the upcoming week if their symptoms persist for further evaluation and workup.        Final diagnoses:  COVID    ED Discharge Orders          Ordered    albuterol  (VENTOLIN  HFA) 108 (90 Base) MCG/ACT inhaler  Every 6 hours PRN        08/23/24 1405  Francis Ileana SAILOR, PA-C 08/23/24 1405    Tegeler, Lonni PARAS, MD 08/23/24 [phone]

## 2024-09-29 ENCOUNTER — Telehealth (INDEPENDENT_AMBULATORY_CARE_PROVIDER_SITE_OTHER): Payer: Self-pay

## 2024-09-29 NOTE — Telephone Encounter (Signed)
 Copied from CRM #[id number]. Topic: Appointments - Scheduling Inquiry for Clinic >> Sep 26, 2024  3:26 PM Rosaria E wrote: Reason for CRM: Pt needs stitches removed and a ED follow up, nothing available soon enough. Please advise   Best contact: [phone]   ----------------------------------------------------------------------- From previous Reason for Contact - Scheduling: Patient/patient representative is calling to schedule an appointment. Refer to attachments for appointment information.

## 2024-10-01 ENCOUNTER — Ambulatory Visit (HOSPITAL_COMMUNITY)
Admission: EM | Admit: 2024-10-01 | Discharge: 2024-10-01 | Disposition: A | Discharge status: Home / Self Care | Attending: Emergency Medicine | Admitting: Emergency Medicine

## 2024-10-01 ENCOUNTER — Other Ambulatory Visit: Payer: Self-pay

## 2024-10-01 ENCOUNTER — Encounter (HOSPITAL_COMMUNITY): Payer: Self-pay | Admitting: Emergency Medicine

## 2024-10-01 DIAGNOSIS — M545 Low back pain, unspecified: Secondary | ICD-10-CM

## 2024-10-01 DIAGNOSIS — M542 Cervicalgia: Secondary | ICD-10-CM

## 2024-10-01 DIAGNOSIS — Z4802 Encounter for removal of sutures: Secondary | ICD-10-CM

## 2024-10-01 MED ORDER — METHOCARBAMOL 500 MG PO TABS: 500.0000 mg | ORAL_TABLET | Freq: Two times a day (BID) | ORAL | 0 refills | Status: AC
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
## 2024-10-01 NOTE — Discharge Instructions (Addendum)
 Take the Robaxin  twice daily as needed for muscular pain.  This may cause drowsiness or sedation.  You can continue to use Tylenol  as needed.  Heat and warm compress will help further loosen your stiff muscles.  If this persists over the next week please follow-up with sports medicine for further evaluation.
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
## 2024-12-16 ENCOUNTER — Telehealth

## 2024-12-16 DIAGNOSIS — H6690 Otitis media, unspecified, unspecified ear: Secondary | ICD-10-CM | POA: Diagnosis not present

## 2024-12-16 MED ORDER — AMOXICILLIN 500 MG PO CAPS: 500.0000 mg | ORAL_CAPSULE | Freq: Three times a day (TID) | ORAL | 0 refills | Status: AC

## 2024-12-16 MED ORDER — AZELASTINE HCL 0.1 % NA SOLN: 2.0000 | Freq: Two times a day (BID) | NASAL | 0 refills | Status: AC

## 2024-12-16 NOTE — Patient Instructions (Signed)
 " Susan Clements, thank you for joining Jon CHRISTELLA Belt, NP for today's virtual visit.  While this provider is not your primary care provider (PCP), if your PCP is located in our provider database this encounter information will be shared with them immediately following your visit.   A [facility] MyChart account gives you access to today's visit and all your visits, tests, and labs performed at [facility]  click here if you don't have a [facility] MyChart account or go to [email or web address]  Consent: (Patient) Susan Clements provided verbal consent for this virtual visit at the beginning of the encounter.  Current Medications:  Current Outpatient Medications:    amoxicillin (AMOXIL) 500 MG capsule, Take 1 capsule (500 mg total) by mouth 3 (three) times daily for 10 days., Disp: 30 capsule, Rfl: 0   azelastine (ASTELIN) 0.1 % nasal spray, Place 2 sprays into both nostrils 2 (two) times daily. Use in each nostril as directed, Disp: 30 mL, Rfl: 0   albuterol  (VENTOLIN  HFA) 108 (90 Base) MCG/ACT inhaler, Inhale 2 puffs into the lungs every 4 (four) hours as needed for wheezing or shortness of breath. (Patient not taking: Reported on 03/30/2023), Disp: 1 each, Rfl: 0   albuterol  (VENTOLIN  HFA) 108 (90 Base) MCG/ACT inhaler, Inhale 1-2 puffs into the lungs every 6 (six) hours as needed for wheezing or shortness of breath., Disp: 8.5 g, Rfl: 0   Ascorbic Acid  (VITAMIN C  PO), Take 1 tablet by mouth daily. (Patient not taking: Reported on 01/23/2024), Disp: , Rfl:    methocarbamol  (ROBAXIN ) 500 MG tablet, Take 1 tablet (500 mg total) by mouth 2 (two) times daily., Disp: 20 tablet, Rfl: 0   Norethindrone Acetate-Ethinyl Estrad-FE (LOESTRIN 24 FE) 1-20 MG-MCG(24) tablet, Take 1 tablet by mouth daily. (Patient not taking: Reported on 07/10/2024), Disp: 28 tablet, Rfl: 11   traMADol  (ULTRAM ) 50 MG tablet, Take 1-2 tablets (50-100 mg total) by mouth every 6 (six) hours as needed for  moderate pain or severe pain. (Patient not taking: Reported on 01/23/2024), Disp: 20 tablet, Rfl: 0   VITAMIN D PO, Take 1 tablet by mouth daily. (Patient not taking: Reported on 01/23/2024), Disp: , Rfl:    Medications ordered in this encounter:  Meds ordered this encounter  Medications   amoxicillin (AMOXIL) 500 MG capsule    Sig: Take 1 capsule (500 mg total) by mouth 3 (three) times daily for 10 days.    Dispense:  30 capsule    Refill:  0   azelastine (ASTELIN) 0.1 % nasal spray    Sig: Place 2 sprays into both nostrils 2 (two) times daily. Use in each nostril as directed    Dispense:  30 mL    Refill:  0     *If you need refills on other medications prior to your next appointment, please contact your pharmacy*  Follow-Up: Call back or seek an in-person evaluation if the symptoms worsen or if the condition fails to improve as anticipated.   Virtual Care [phone]  Other Instructions  Also purchase saline nasal spray and use several times a day to help any head congestion and/or pressure on your ears to drain.   Taking mucinex would also be helpful for this.   If you are not getting better or if you start seeing pus-like stuff draining from your ears, please be seen in person such as at an urgent care   If you have been instructed to have an in-person evaluation  today at a local Urgent Care facility, please use the link below. It will take you to a list of all of our available [facility], including address, phone number and hours of operation. Please do not delay care.  [facility]  If you or a family member do not have a primary care provider, use the link below to schedule a visit and establish care. When you choose a [facility] primary care physician or advanced practice provider, you gain a long-term partner in health. Find a Primary Care Provider  Learn more about [facility]'s in-office and virtual care options: [facility] -  Get Care Now  "

## 2024-12-16 NOTE — Progress Notes (Signed)
 " Virtual Visit Consent   Susan Clements, you are scheduled for a virtual visit with a [facility] provider today. Just as with appointments in the office, your consent must be obtained to participate. Your consent will be active for this visit and any virtual visit you may have with one of our providers in the next 365 days. If you have a MyChart account, a copy of this consent can be sent to you electronically.  As this is a virtual visit, video technology does not allow for your provider to perform a traditional examination. This may limit your provider's ability to fully assess your condition. If your provider identifies any concerns that need to be evaluated in person or the need to arrange testing (such as labs, EKG, etc.), we will make arrangements to do so. Although advances in technology are sophisticated, we cannot ensure that it will always work on either your end or our end. If the connection with a video visit is poor, the visit may have to be switched to a telephone visit. With either a video or telephone visit, we are not always able to ensure that we have a secure connection.  By engaging in this virtual visit, you consent to the provision of healthcare and authorize for your insurance to be billed (if applicable) for the services provided during this visit. Depending on your insurance coverage, you may receive a charge related to this service.  I need to obtain your verbal consent now. Are you willing to proceed with your visit today? Susan Clements has provided verbal consent on 12/16/2024 for a virtual visit (video or telephone). Jon CHRISTELLA Belt, NP  Date: 12/16/2024 10:25 AM   Virtual Visit via Video Note   I, Jon CHRISTELLA Belt, connected with  Susan Clements  ([id number], 17-Aug-1991) on 12/16/2024 at 10:15 AM EST by a video-enabled telemedicine application and verified that I am speaking with the correct person using two identifiers.  Location: Patient: Virtual Visit  Location Patient: Home Provider: Virtual Visit Location Provider: Home Office   I discussed the limitations of evaluation and management by telemedicine and the availability of in person appointments. The patient expressed understanding and agreed to proceed.    History of Present Illness: Susan Clements is a 33 y.o. who identifies as a female who was assigned female at birth, and is being seen today for ear pain.   No cough. Some congestion, sinus pressure and post nasal drainage.   For 1-2 weeks, earaches, mostly on L side  Ear pain is like a sharp pain, like when she had an ear infection in the past. Can feel lymph nodes in submandibular area, bigger on the left  Headaches, mostly also left side.   Had some clear liquid drain from L ear canal a few days ago. No drainage since. No purulent drainage from ears.   No fevers.   Taking tylenol , took old amoxicillin yesterday and it seemed to help.   She has had worse allergies this fall and sometimes take zyrtec. Has had more congestion than normal this fall.   HPI: HPI  Problems:  Patient Active Problem List   Diagnosis Date Noted   Appendicitis 03/29/2023   Encounter for postpartum visit 06/13/2021   [redacted] weeks gestation of pregnancy 05/02/2021   Vaginal delivery 05/01/2021   IUD (intrauterine device) in place 05/01/2021   Shoulder dystocia, delivered 05/01/2021   Encounter for elective induction of labor 04/30/2021   BMI 45.0-49.9, adult (HCC) 04/05/2021  Gestational diabetes 02/20/2021   Heartburn during pregnancy, antepartum 12/31/2020   Anxiety 12/31/2020   Major depressive disorder, single episode, severe (HCC)    Obesity during pregnancy 11/05/2020   Supervision of high risk pregnancy, antepartum 10/29/2020    Allergies: Allergies[1] Medications: Current Medications[2]  Observations/Objective: Patient is well-developed, well-nourished in no acute distress.  Resting comfortably  at home.  Head is  normocephalic, atraumatic.  No labored breathing.  Speech is clear and coherent with logical content.  Patient is alert and oriented at baseline.    Assessment and Plan: 1. Acute otitis media, unspecified otitis media type (Primary) - amoxicillin (AMOXIL) 500 MG capsule; Take 1 capsule (500 mg total) by mouth 3 (three) times daily for 10 days.  Dispense: 30 capsule; Refill: 0 - azelastine (ASTELIN) 0.1 % nasal spray; Place 2 sprays into both nostrils 2 (two) times daily. Use in each nostril as directed  Dispense: 30 mL; Refill: 0  I'm not sure if this is AOM but she has had sx for 1-2 weeks, worsening, and it's possible her increased congestion this fall has resulted in a 2ndary bacterial infection. She understands to be seen in person if this treatment plan does not help her.   Follow Up Instructions: I discussed the assessment and treatment plan with the patient. The patient was provided an opportunity to ask questions and all were answered. The patient agreed with the plan and demonstrated an understanding of the instructions.  A copy of instructions were sent to the patient via MyChart unless otherwise noted below.   The patient was advised to call back or seek an in-person evaluation if the symptoms worsen or if the condition fails to improve as anticipated.    Jon CHRISTELLA Belt, NP    [1]  Allergies Allergen Reactions   Shellfish Protein-Containing Drug Products Anaphylaxis   Nsaids   [2]  Current Outpatient Medications:    amoxicillin (AMOXIL) 500 MG capsule, Take 1 capsule (500 mg total) by mouth 3 (three) times daily for 10 days., Disp: 30 capsule, Rfl: 0   azelastine (ASTELIN) 0.1 % nasal spray, Place 2 sprays into both nostrils 2 (two) times daily. Use in each nostril as directed, Disp: 30 mL, Rfl: 0   albuterol  (VENTOLIN  HFA) 108 (90 Base) MCG/ACT inhaler, Inhale 2 puffs into the lungs every 4 (four) hours as needed for wheezing or shortness of breath. (Patient not taking:  Reported on 03/30/2023), Disp: 1 each, Rfl: 0   albuterol  (VENTOLIN  HFA) 108 (90 Base) MCG/ACT inhaler, Inhale 1-2 puffs into the lungs every 6 (six) hours as needed for wheezing or shortness of breath., Disp: 8.5 g, Rfl: 0   Ascorbic Acid  (VITAMIN C  PO), Take 1 tablet by mouth daily. (Patient not taking: Reported on 01/23/2024), Disp: , Rfl:    methocarbamol  (ROBAXIN ) 500 MG tablet, Take 1 tablet (500 mg total) by mouth 2 (two) times daily., Disp: 20 tablet, Rfl: 0   Norethindrone Acetate-Ethinyl Estrad-FE (LOESTRIN 24 FE) 1-20 MG-MCG(24) tablet, Take 1 tablet by mouth daily. (Patient not taking: Reported on 07/10/2024), Disp: 28 tablet, Rfl: 11   traMADol  (ULTRAM ) 50 MG tablet, Take 1-2 tablets (50-100 mg total) by mouth every 6 (six) hours as needed for moderate pain or severe pain. (Patient not taking: Reported on 01/23/2024), Disp: 20 tablet, Rfl: 0   VITAMIN D PO, Take 1 tablet by mouth daily. (Patient not taking: Reported on 01/23/2024), Disp: , Rfl:   "

## 2025-01-22 ENCOUNTER — Other Ambulatory Visit: Payer: Self-pay | Admitting: Emergency Medicine

## 2025-01-22 DIAGNOSIS — H6690 Otitis media, unspecified, unspecified ear: Secondary | ICD-10-CM
